# Patient Record
Sex: Female | Born: 1960
Health system: Southern US, Community
[De-identification: ages and names within clinical notes are randomized; demographics above are authoritative.]

## PROBLEM LIST (undated history)

## (undated) DIAGNOSIS — I4892 Unspecified atrial flutter: Secondary | ICD-10-CM

## (undated) DIAGNOSIS — G43909 Migraine, unspecified, not intractable, without status migrainosus: Secondary | ICD-10-CM

## (undated) DIAGNOSIS — G4733 Obstructive sleep apnea (adult) (pediatric): Secondary | ICD-10-CM

## (undated) HISTORY — DX: Obstructive sleep apnea (adult) (pediatric): G47.33

## (undated) HISTORY — DX: Unspecified atrial flutter: I48.92

## (undated) HISTORY — DX: Morbid (severe) obesity due to excess calories: E66.01

## (undated) HISTORY — PX: TONSILLECTOMY: SUR1361

---

## 1997-12-10 ENCOUNTER — Other Ambulatory Visit: Admission: RE | Admit: 1997-12-10 | Discharge: 1997-12-10 | Payer: Self-pay | Admitting: Obstetrics and Gynecology

## 1998-06-10 ENCOUNTER — Inpatient Hospital Stay (HOSPITAL_COMMUNITY): Admission: AD | Admit: 1998-06-10 | Discharge: 1998-06-14 | Payer: Self-pay | Admitting: Obstetrics and Gynecology

## 1998-08-01 ENCOUNTER — Other Ambulatory Visit: Admission: RE | Admit: 1998-08-01 | Discharge: 1998-08-01 | Payer: Self-pay | Admitting: Obstetrics and Gynecology

## 2000-11-19 ENCOUNTER — Encounter: Payer: Self-pay | Admitting: Emergency Medicine

## 2000-11-19 ENCOUNTER — Emergency Department (HOSPITAL_COMMUNITY): Admission: EM | Admit: 2000-11-19 | Discharge: 2000-11-19 | Payer: Self-pay | Admitting: Emergency Medicine

## 2001-03-19 ENCOUNTER — Other Ambulatory Visit: Admission: RE | Admit: 2001-03-19 | Discharge: 2001-03-19 | Payer: Self-pay | Admitting: Obstetrics and Gynecology

## 2014-04-11 DIAGNOSIS — R0789 Other chest pain: Secondary | ICD-10-CM | POA: Diagnosis not present

## 2014-04-11 DIAGNOSIS — R079 Chest pain, unspecified: Secondary | ICD-10-CM | POA: Diagnosis present

## 2014-04-11 DIAGNOSIS — Z8679 Personal history of other diseases of the circulatory system: Secondary | ICD-10-CM | POA: Diagnosis not present

## 2014-04-12 ENCOUNTER — Emergency Department (HOSPITAL_BASED_OUTPATIENT_CLINIC_OR_DEPARTMENT_OTHER): Payer: 59

## 2014-04-12 ENCOUNTER — Emergency Department (HOSPITAL_BASED_OUTPATIENT_CLINIC_OR_DEPARTMENT_OTHER)
Admission: EM | Admit: 2014-04-12 | Discharge: 2014-04-12 | Disposition: A | Discharge status: Home / Self Care | Payer: 59 | Attending: Emergency Medicine | Admitting: Emergency Medicine

## 2014-04-12 ENCOUNTER — Encounter (HOSPITAL_BASED_OUTPATIENT_CLINIC_OR_DEPARTMENT_OTHER): Payer: Self-pay | Admitting: *Deleted

## 2014-04-12 DIAGNOSIS — R079 Chest pain, unspecified: Secondary | ICD-10-CM

## 2014-04-12 HISTORY — DX: Migraine, unspecified, not intractable, without status migrainosus: G43.909

## 2014-04-12 LAB — CBC WITH DIFFERENTIAL/PLATELET
Basophils Absolute: 0.1 10*3/uL (ref 0.0–0.1)
Basophils Relative: 1 % (ref 0–1)
Eosinophils Absolute: 0.2 10*3/uL (ref 0.0–0.7)
Eosinophils Relative: 2 % (ref 0–5)
HCT: 45.8 % (ref 36.0–46.0)
Hemoglobin: 15.1 g/dL — ABNORMAL HIGH (ref 12.0–15.0)
LYMPHS ABS: 2.6 10*3/uL (ref 0.7–4.0)
Lymphocytes Relative: 27 % (ref 12–46)
MCH: 30.1 pg (ref 26.0–34.0)
MCHC: 33 g/dL (ref 30.0–36.0)
MCV: 91.2 fL (ref 78.0–100.0)
Monocytes Absolute: 0.9 10*3/uL (ref 0.1–1.0)
Monocytes Relative: 9 % (ref 3–12)
NEUTROS PCT: 61 % (ref 43–77)
Neutro Abs: 6 10*3/uL (ref 1.7–7.7)
PLATELETS: 352 10*3/uL (ref 150–400)
RBC: 5.02 MIL/uL (ref 3.87–5.11)
RDW: 13.6 % (ref 11.5–15.5)
WBC: 9.7 10*3/uL (ref 4.0–10.5)

## 2014-04-12 LAB — COMPREHENSIVE METABOLIC PANEL
ALBUMIN: 4.1 g/dL (ref 3.5–5.2)
ALK PHOS: 67 U/L (ref 39–117)
ALT: 18 U/L (ref 0–35)
ANION GAP: 10 (ref 5–15)
AST: 21 U/L (ref 0–37)
BILIRUBIN TOTAL: 0.4 mg/dL (ref 0.3–1.2)
BUN: 19 mg/dL (ref 6–23)
CHLORIDE: 105 mmol/L (ref 96–112)
CO2: 25 mmol/L (ref 19–32)
Calcium: 8.9 mg/dL (ref 8.4–10.5)
Creatinine, Ser: 0.68 mg/dL (ref 0.50–1.10)
GFR calc Af Amer: >90 mL/min (ref >90)
GFR calc non Af Amer: >90 mL/min (ref >90)
Glucose, Bld: 120 mg/dL — ABNORMAL HIGH (ref 70–99)
POTASSIUM: 3.7 mmol/L (ref 3.5–5.1)
Sodium: 140 mmol/L (ref 135–145)
TOTAL PROTEIN: 7.3 g/dL (ref 6.0–8.3)

## 2014-04-12 LAB — TROPONIN I: Troponin I: <0.03 ng/mL (ref <0.031)

## 2014-04-12 LAB — LIPASE, BLOOD: Lipase: 33 U/L (ref 11–59)

## 2014-04-12 NOTE — Discharge Instructions (Signed)
Ibuprofen 600 mg every 6 hours as needed for pain.  Call the cardiology clinic to arrange a follow-up appointment in the next 2-3 days. The contact information has been provided in this discharge summary.  Return to the ER if your symptoms significantly worsen or change.   Chest Pain (Nonspecific) It is often hard to give a specific diagnosis for the cause of chest pain. There is always a chance that your pain could be related to something serious, such as a heart attack or a blood clot in the lungs. You need to follow up with your health care provider for further evaluation. CAUSES   Heartburn.  Pneumonia or bronchitis.  Anxiety or stress.  Inflammation around your heart (pericarditis) or lung (pleuritis or pleurisy).  A blood clot in the lung.  A collapsed lung (pneumothorax). It can develop suddenly on its own (spontaneous pneumothorax) or from trauma to the chest.  Shingles infection (herpes zoster virus). The chest wall is composed of bones, muscles, and cartilage. Any of these can be the source of the pain.  The bones can be bruised by injury.  The muscles or cartilage can be strained by coughing or overwork.  The cartilage can be affected by inflammation and become sore (costochondritis). DIAGNOSIS  Lab tests or other studies may be needed to find the cause of your pain. Your health care provider may have you take a test called an ambulatory electrocardiogram (ECG). An ECG records your heartbeat patterns over a 24-hour period. You may also have other tests, such as:  Transthoracic echocardiogram (TTE). During echocardiography, sound waves are used to evaluate how blood flows through your heart.  Transesophageal echocardiogram (TEE).  Cardiac monitoring. This allows your health care provider to monitor your heart rate and rhythm in real time.  Holter monitor. This is a portable device that records your heartbeat and can help diagnose heart arrhythmias. It allows your  health care provider to track your heart activity for several days, if needed.  Stress tests by exercise or by giving medicine that makes the heart beat faster. TREATMENT   Treatment depends on what may be causing your chest pain. Treatment may include:  Acid blockers for heartburn.  Anti-inflammatory medicine.  Pain medicine for inflammatory conditions.  Antibiotics if an infection is present.  You may be advised to change lifestyle habits. This includes stopping smoking and avoiding alcohol, caffeine, and chocolate.  You may be advised to keep your head raised (elevated) when sleeping. This reduces the chance of acid going backward from your stomach into your esophagus. Most of the time, nonspecific chest pain will improve within 2-3 days with rest and mild pain medicine.  HOME CARE INSTRUCTIONS   If antibiotics were prescribed, take them as directed. Finish them even if you start to feel better.  For the next few days, avoid physical activities that bring on chest pain. Continue physical activities as directed.  Do not use any tobacco products, including cigarettes, chewing tobacco, or electronic cigarettes.  Avoid drinking alcohol.  Only take medicine as directed by your health care provider.  Follow your health care provider's suggestions for further testing if your chest pain does not go away.  Keep any follow-up appointments you made. If you do not go to an appointment, you could develop lasting (chronic) problems with pain. If there is any problem keeping an appointment, call to reschedule. SEEK MEDICAL CARE IF:   Your chest pain does not go away, even after treatment.  You have a rash  with blisters on your chest.  You have a fever. SEEK IMMEDIATE MEDICAL CARE IF:   You have increased chest pain or pain that spreads to your arm, neck, jaw, back, or abdomen.  You have shortness of breath.  You have an increasing cough, or you cough up blood.  You have severe  back or abdominal pain.  You feel nauseous or vomit.  You have severe weakness.  You faint.  You have chills. This is an emergency. Do not wait to see if the pain will go away. Get medical help at once. Call your local emergency services (911 in [place] Do not drive yourself to the hospital. MAKE SURE YOU:   Understand these instructions.  Will watch your condition.  Will get help right away if you are not doing well or get worse. Document Released: 10/18/2004 Document Revised: 01/13/2013 Document Reviewed: 08/14/2007 [facility] Patient Information 2015 [place], [place]. This information is not intended to replace advice given to you by your health care provider. Make sure you discuss any questions you have with your health care provider.

## 2014-04-12 NOTE — ED Provider Notes (Signed)
CSN: [id number]     Arrival date & time 04/11/14  2349 History   This chart was scribed for Geoffery Lyonsouglas Meagon Duskin, MD by Abel PrestoKara Demonbreun, ED Scribe. This patient was seen in room MH11/MH11 and the patient's care was started at 12:54 AM.    Chief Complaint  Patient presents with  . Chest Pain     Patient is a 54 y.o. female presenting with chest pain. The history is provided by the patient. No language interpreter was used.  Chest Pain  HPI Comments: Beth Bowman is a 54 y.o. female who presents to the Emergency Department complaining of intermittent chest pain with onset a week ago which has partially resolved. Pt with h/o of chest pain with onset in fall 2015 and was suppose to have stress test done, but decided not to, due to symptoms occuring less often and insurance problems. Pt states pain location and quality varies with pain occasionally radiating to jaw and left arm. Pt denies knowing any aggravating factors. Pt notes associated SOB and palpitations.  Pt states she had begun working out before first onset last fall, but had since ceased exercising until 2 weeks ago.  Pt denies h/o DM, HTN, and HLD. Pt is not a smoker. Pt denies diaphoresis.   Past Medical History  Diagnosis Date  . Migraines    Past Surgical History  Procedure Laterality Date  . Cesarean section      x4  . Tonsillectomy     History reviewed. No pertinent family history. History  Substance Use Topics  . Smoking status: Never Smoker   . Smokeless tobacco: Not on file  . Alcohol Use: No   OB History    No data available     Review of Systems  Cardiovascular: Positive for chest pain.  A complete 10 system review of systems was obtained and all systems are negative except as noted in the HPI and PMH.      Allergies  Review of patient's allergies indicates no known allergies.  Home Medications   Prior to Admission medications   Medication Sig Start Date End Date Taking? Authorizing Provider  Esomeprazole  Magnesium (NEXIUM PO) Take by mouth.   Yes Historical Provider, MD   BP 130/71 mmHg  Pulse 78  Temp(Src) 98.5 F (36.9 C) (Oral)  Resp 18  Ht 5\' 3"  (1.6 m)  Wt 220 lb (99.791 kg)  BMI 38.98 kg/m2  SpO2 100% Physical Exam  Constitutional: She is oriented to person, place, and time. She appears well-developed and well-nourished. No distress.  HENT:  Head: Normocephalic and atraumatic.  Eyes: Conjunctivae and EOM are normal.  Neck: Normal range of motion. Neck supple.  Cardiovascular: Normal rate, regular rhythm and normal heart sounds.   Pulmonary/Chest: Effort normal and breath sounds normal.  Abdominal: Soft. She exhibits no distension. There is no tenderness.  Musculoskeletal: Normal range of motion.  Neurological: She is alert and oriented to person, place, and time.  Skin: Skin is warm and dry.  Psychiatric: She has a normal mood and affect. Her behavior is normal. Judgment normal.  Nursing note and vitals reviewed.   ED Course  Procedures (including critical care time) DIAGNOSTIC STUDIES: Oxygen Saturation is 100% on room air, normal by my interpretation.    COORDINATION OF CARE: 1:02 AM Discussed treatment plan with patient at beside, the patient agrees with the plan and has no further questions at this time.   Labs Review Labs Reviewed  CBC WITH DIFFERENTIAL/PLATELET - Abnormal; Notable for  the following:    Hemoglobin 15.1 (*)    All other components within normal limits  TROPONIN I  LIPASE, BLOOD  COMPREHENSIVE METABOLIC PANEL    Imaging Review No results found.  ED ECG REPORT   Date: 04/12/2014  Rate: 75  Rhythm: normal sinus rhythm  QRS Axis: normal  Intervals: normal  ST/T Wave abnormalities: normal  Conduction Disutrbances:none  Narrative Interpretation:   Old EKG Reviewed: none available  I have personally reviewed the EKG tracing and agree with the computerized printout as noted.   MDM   Final diagnoses:  None    Patient presents with  a several month history of intermittent chest discomfort. It became worse over the past 2 days. She was to have a stress test several months ago, however this did not materialize due to insurance reasons. She began experiencing this discomfort again recently and presents for evaluation of this. Her EKG reveals no acute changes and troponin is negative. She has called a local cardiologist to make an appointment, however has not heard back. If she is unable to make an appointment with the [facility] cardiologist, she was given the follow-up information for the cardiology clinic [facility] with whom she can follow-up. She understands to return if her symptoms worsen or change.   I personally performed the services described in this documentation, which was scribed in my presence. The recorded information has been reviewed and is accurate.      Geoffery Lyons, MD 04/12/14 [phone]

## 2014-04-12 NOTE — ED Notes (Signed)
C/o CP. Pt goes back and forth will old and new information. H/o CP with work up in the fall. Was suppose to have a stress test in the fall but did not. Pt of [facility] Cephus [facility]guilar [facility] referred to [facility] (in [place]). Has been having intermittant and fluctuating CP for ~ 1 week. Took ASA 8-81mg  at 2215. Last nexium taken at 2100. On arrival pain was a 5/10, denies any pain at this time. Last ate 2000.

## 2014-04-14 ENCOUNTER — Encounter: Payer: Self-pay | Admitting: Cardiovascular Disease

## 2014-04-14 ENCOUNTER — Ambulatory Visit (INDEPENDENT_AMBULATORY_CARE_PROVIDER_SITE_OTHER): Payer: 59 | Admitting: Cardiovascular Disease

## 2014-04-14 VITALS — BP 102/80 | HR 89 | Ht 63.0 in | Wt 230.4 lb

## 2014-04-14 DIAGNOSIS — R0789 Other chest pain: Secondary | ICD-10-CM | POA: Diagnosis not present

## 2014-04-14 DIAGNOSIS — R5383 Other fatigue: Secondary | ICD-10-CM | POA: Diagnosis not present

## 2014-04-14 DIAGNOSIS — R5382 Chronic fatigue, unspecified: Secondary | ICD-10-CM

## 2014-04-14 DIAGNOSIS — R002 Palpitations: Secondary | ICD-10-CM | POA: Insufficient documentation

## 2014-04-14 NOTE — Progress Notes (Signed)
Cardiology Office Note   Date:  04/14/2014   ID:  Beth Bowman, DOB [birth year], MRN [id number]  PCP:  No primary care provider on file.  Cardiologist:   Xavior Niazi, Deloris PingPhilip J, MD   Chief Complaint  Patient presents with  . Chest Pain      History of Present Illness: Beth Bowman is a 54 y.o. female who presents for chest pain .   Last fall, she started having some vague upper left sided chest pain.  occasionall she would have upper back pain. Also has left upper arm pain and tingling. She went to see [name] Francene Boyers( Darrell  Kalihl, MD ) , he had initially recommended an echo and then eventually recommended a GXT   She did not have insurance so she did not have the tests.    She also has palpitations for the past year She knows that she does not sleep well.  Wakes up in the early AM - has trouble getting back to sleep. Some early morning headaches. Sleepy during the day.   This past Sunday night, she had some palpitations / heart pounding after taking a nap and went to the ER.   She admits that she has been anxious about all of these symptoms   She joined the gym in the fall.  Did not have any CP or shortness of breath with exercise.  When these symptoms started up several months ago, she stopped going to the gym.   She works for [employer].   Past Medical History  Diagnosis Date  . Migraines     Past Surgical History  Procedure Laterality Date  . Cesarean section      x4  . Tonsillectomy       Current Outpatient Prescriptions  Medication Sig Dispense Refill  . Co-Enzyme Q-10 30 MG CAPS Take 30 mg by mouth daily.    . norethindrone (MICRONOR,CAMILA,ERRIN) 0.35 MG tablet Take by mouth daily.     No current facility-administered medications for this visit.    Allergies:   Review of patient's allergies indicates no known allergies.    Social History:  The patient  reports that she has never smoked. She does not have any smokeless tobacco history on  file. She reports that she does not drink alcohol or use illicit drugs.   Family History:  The patient's family history is not on file.    ROS:  Please see the history of present illness.    Review of Systems: Constitutional:  denies fever, chills, diaphoresis, appetite change and fatigue.  HEENT: denies photophobia, eye pain, redness, hearing loss, ear pain, congestion, sore throat, rhinorrhea, sneezing, neck pain, neck stiffness and tinnitus.  Respiratory: admits to  DOE,   Cardiovascular: admits to chest pain, palpitations and.  Denies any  leg swelling.  Gastrointestinal: denies nausea, vomiting, abdominal pain, diarrhea, constipation, blood in stool.  Genitourinary: denies dysuria, urgency, frequency, hematuria, flank pain and difficulty urinating.  Musculoskeletal: denies  myalgias, back pain, joint swelling, arthralgias and gait problem.   Skin: denies pallor, rash and wound.  Neurological: denies dizziness, seizures, syncope, weakness, light-headedness, numbness and headaches.   Hematological: denies adenopathy, easy bruising, personal or family bleeding history.  Psychiatric/ Behavioral: denies suicidal ideation, mood changes, confusion, nervousness, sleep disturbance and agitation.       All other systems are reviewed and negative.    PHYSICAL EXAM: VS:  BP 102/80 mmHg  Pulse 89  Ht 5\' 3"  (1.6 m)  Wt 230 lb  6.4 oz (104.509 kg)  BMI 40.82 kg/m2 , BMI Body mass index is 40.82 kg/(m^2). GEN: Well nourished, well developed, in no acute distress HEENT: normal Neck: no JVD, carotid bruits, or masses Cardiac: RRR; no murmurs, rubs, or gallops,no edema  Respiratory:  clear to auscultation bilaterally, normal work of breathing GI: soft, nontender, nondistended, + BS MS: no deformity or atrophy Skin: warm and dry, no rash Neuro:  Strength and sensation are intact Psych: normal   EKG:  EKG is ordered today. The ekg ordered today demonstrates NSR at 89 RAD, NS ST changes,  no changes from previous ECG    Recent Labs: 04/12/2014: ALT 18; BUN 19; Creatinine 0.68; Hemoglobin 15.1*; Platelets 352; Potassium 3.7; Sodium 140    Lipid Panel No results found for: CHOL, TRIG, HDL, CHOLHDL, VLDL, LDLCALC, LDLDIRECT    Wt Readings from Last 3 Encounters:  04/14/14 230 lb 6.4 oz (104.509 kg)  04/12/14 220 lb (99.791 kg)      Other studies Reviewed: Additional studies/ records that were reviewed today include: . Review of the above records demonstrates:    ASSESSMENT AND PLAN:  1.  Palpitations/fatigue: The patient present with multiple palpitations, atypical chest pains, generalized fatigue. She seen a cardiologist last year but did not return. Her symptoms do not sound specifically cardiac to me. I suspect that she may have sleep apnea. She admits that she has trouble sleeping and wakes up very tired. In fact one of her episodes of racing heart occurred after she had taken a nap.  He knows that she snores very loudly but her husband has never told her that she has apneic episodes at night. This may be because he also snores loudly.  Because of her nonspecific EKG changes and her palpitations we will get an echocardiogram. It will be helpful to know that she has normal cardiac function and normal valvular function. We will also schedule her for a split-night sleep study. I'll see her back in the office in 2-3 months for follow-up visit.  I strongly advised her to work on a good diet, exercise, and weight loss program. We'll check fasting lipids today as well as a TSH.  Current medicines are reviewed at length with the patient today.  The patient does not have concerns regarding medicines.  The following changes have been made:  no change  Labs/ tests ordered today include:  No orders of the defined types were placed in this encounter.     Disposition:   FU with me in 2-3 months     Signed, Cortez Flippen, Deloris Ping, MD  04/14/2014 3:32 PM    [facility] [address] [place], [place], [place]  [address] Phone: [phone]; Fax: [phone]

## 2014-04-14 NOTE — Patient Instructions (Addendum)
Your physician recommends that you return for lab work: TODAY (TSH/Lipid)  Your physician has recommended that you have a sleep study. This test records several body functions during sleep, including: brain activity, eye movement, oxygen and carbon dioxide blood levels, heart rate and rhythm, breathing rate and rhythm, the flow of air through your mouth and nose, snoring, body muscle movements, and chest and belly movement.  Your physician has requested that you have an echocardiogram. Echocardiography is a painless test that uses sound waves to create images of your heart. It provides your doctor with information about the size and shape of your heart and how well your heart's chambers and valves are working. This procedure takes approximately one hour. There are no restrictions for this procedure.  Your physician recommends that you schedule a follow-up appointment in: 2-3 months with Dr. Elease HashimotoNahser  Your physician recommends that you continue on your current medications as directed. Please refer to the Current Medication list given to you today.

## 2014-04-15 ENCOUNTER — Ambulatory Visit (HOSPITAL_COMMUNITY): Payer: 59 | Attending: Cardiovascular Disease | Admitting: Radiology

## 2014-04-15 DIAGNOSIS — R5382 Chronic fatigue, unspecified: Secondary | ICD-10-CM | POA: Insufficient documentation

## 2014-04-15 DIAGNOSIS — R0789 Other chest pain: Secondary | ICD-10-CM | POA: Diagnosis not present

## 2014-04-15 DIAGNOSIS — R002 Palpitations: Secondary | ICD-10-CM | POA: Diagnosis not present

## 2014-04-15 LAB — LIPID PANEL
CHOLESTEROL: 167 mg/dL (ref 0–200)
HDL: 49.3 mg/dL (ref >39.00)
LDL Cholesterol: 101 mg/dL — ABNORMAL HIGH (ref 0–99)
NonHDL: 117.7
Total CHOL/HDL Ratio: 3
Triglycerides: 86 mg/dL (ref 0.0–149.0)
VLDL: 17.2 mg/dL (ref 0.0–40.0)

## 2014-04-15 LAB — TSH: TSH: 1.56 u[IU]/mL (ref 0.35–4.50)

## 2014-04-15 NOTE — Progress Notes (Signed)
Echocardiogram performed.  

## 2014-04-30 ENCOUNTER — Telehealth: Payer: Self-pay | Admitting: Cardiovascular Disease

## 2014-04-30 MED ORDER — METOPROLOL SUCCINATE ER 25 MG PO TB24: 25.0000 mg | ORAL_TABLET | Freq: Every day | ORAL | Status: DC

## 2014-04-30 NOTE — Telephone Encounter (Signed)
Left message for patient to call office and ask for [place]le, [place]

## 2014-04-30 NOTE — Telephone Encounter (Signed)
Spoke with patient who states she is experiencing fast heart rate, high blood pressure and palpitations onset 11 PM yesterday; felt light headed, palpitations after eating a bowl of cereal - 118/98, HR 126  Vitals now BP:  130/107, HR 137 possibly irregular; feels weak, jittery currently Denies chest pain/pressure; mild SOB; patient is able to speak in full complete sentences without difficulty while on phone; denies n/v; denies light-headedness

## 2014-04-30 NOTE — Telephone Encounter (Signed)
Left message for patient to call me back. 

## 2014-04-30 NOTE — Telephone Encounter (Signed)
Pt calling c/o tachycardia-rapid heartbeats-elevated BP- wants to know what she should do--pls advise [phone]

## 2014-04-30 NOTE — Telephone Encounter (Signed)
I reviewed patient's complaint with Dr. Elease HashimotoNahser who advised patient start Toprol XL 25 mg once daily.  I will offer patient a nurse visit next week to check HR and BP.

## 2014-04-30 NOTE — Telephone Encounter (Signed)
Follow Up  Pt returning Michelle's phone call. Please call back and discuss.  

## 2014-04-30 NOTE — Telephone Encounter (Signed)
Spoke with patient and reviewed Dr. Harvie BridgeNahser's advice with her.  Patient verbalized understanding and agreement to start Toprol XL 25 mg once daily.  I advised patient that we would like to set up a nurse visit for later in the week for ekg/BP to assess how patient is responding to the medication.  Patient scheduled for nurse visit Thursday 4/14.  Patient is concerned as to the cause of the fast HR and elevated BP.  I advised patient that Dr. Elease HashimotoNahser believes symptoms are r/t sleep apnea and that heart rate is sinus tachycardia.  I advised patient that if HR does not decrease with Toprol or if she becomes light-headed, dizzy, or develops h/a or other concerns to call the oncall provider over the weekend or go to the ED.  I advised patient to use relaxation methods, to decrease intake of caffeine, and to continue to work on healthy low-fat, low-sodium diet and light exercise.  Patient verbalized understanding and agreement with plan.

## 2014-04-30 NOTE — Telephone Encounter (Signed)
Agree with not from Eligha BridegroomMichelle Swinyer, RN. Will start toprol xl 25 a day. Will work her in to see my Monday if she is able

## 2014-05-06 ENCOUNTER — Encounter: Payer: Self-pay | Admitting: Cardiovascular Disease

## 2014-05-06 ENCOUNTER — Ambulatory Visit (INDEPENDENT_AMBULATORY_CARE_PROVIDER_SITE_OTHER): Payer: 59 | Admitting: Nurse Practitioner

## 2014-05-06 VITALS — BP 110/70 | HR 73 | Resp 16 | Wt 230.5 lb

## 2014-05-06 DIAGNOSIS — R002 Palpitations: Secondary | ICD-10-CM | POA: Diagnosis not present

## 2014-05-06 NOTE — Patient Instructions (Signed)
Your physician recommends that you keep your follow-up appointment on June 8 at 4:30 with Dr. Elease HashimotoNahser  Your physician recommends that you continue on your current medications as directed. Please refer to the Current Medication list given to you today.

## 2014-05-06 NOTE — Progress Notes (Signed)
1.) Reason for visit: EKG and BP check for recent addition of Toprol XL 25 mg once daily for c/o palpitations, feeling like heart racing and difficulty sleeping  2.) Name of MD requesting visit: Dr. Elease HashimotoNahser  3.) H&P: Recent hx of heart palpitations; started on Toprol XL on Friday April 8, here for ekg and bp check to evaluate efficacy  4.) ROS related to problem: Patient states she is feeling well; states notices slight fatigue since starting Toprol and understands that this should improve over the next 1-2 weeks; patient is aware to call back with worsening symptoms or concerns  5.) Assessment and plan per MD: follow up with Dr. Elease HashimotoNahser as previously scheduled on June 8; pt has sleep study ordered for late May

## 2014-06-03 ENCOUNTER — Ambulatory Visit: Payer: 59 | Admitting: Cardiovascular Disease

## 2014-06-18 ENCOUNTER — Ambulatory Visit (HOSPITAL_BASED_OUTPATIENT_CLINIC_OR_DEPARTMENT_OTHER): Payer: 59 | Attending: Cardiovascular Disease | Admitting: *Deleted

## 2014-06-18 VITALS — Ht 63.0 in | Wt 218.0 lb

## 2014-06-18 DIAGNOSIS — I491 Atrial premature depolarization: Secondary | ICD-10-CM | POA: Diagnosis not present

## 2014-06-18 DIAGNOSIS — R5382 Chronic fatigue, unspecified: Secondary | ICD-10-CM

## 2014-06-18 DIAGNOSIS — R002 Palpitations: Secondary | ICD-10-CM

## 2014-06-18 DIAGNOSIS — R0683 Snoring: Secondary | ICD-10-CM | POA: Insufficient documentation

## 2014-06-18 DIAGNOSIS — G4733 Obstructive sleep apnea (adult) (pediatric): Secondary | ICD-10-CM | POA: Insufficient documentation

## 2014-06-18 DIAGNOSIS — G4710 Hypersomnia, unspecified: Secondary | ICD-10-CM | POA: Diagnosis present

## 2014-06-22 ENCOUNTER — Encounter (HOSPITAL_BASED_OUTPATIENT_CLINIC_OR_DEPARTMENT_OTHER): Payer: Self-pay | Admitting: *Deleted

## 2014-06-22 ENCOUNTER — Telehealth: Payer: Self-pay | Admitting: Cardiology

## 2014-06-22 DIAGNOSIS — G4733 Obstructive sleep apnea (adult) (pediatric): Secondary | ICD-10-CM

## 2014-06-22 HISTORY — DX: Obstructive sleep apnea (adult) (pediatric): G47.33

## 2014-06-22 NOTE — Telephone Encounter (Signed)
Left message to call back  

## 2014-06-22 NOTE — Sleep Study (Signed)
   NAME: Beth StakesJill L Merrow DATE OF BIRTH:  [birth year] MEDICAL RECORD NUMBER [id number]  LOCATION: [facility]  PHYSICIAN: Makyna Niehoff R  DATE OF STUDY: 06/18/2014  SLEEP STUDY TYPE: Nocturnal Polysomnogram               REFERRING PHYSICIAN: Nahser, Deloris PingPhilip J, MD  INDICATION FOR STUDY: excessive daytime sleepiness, nonrestorative sleep  EPWORTH SLEEPINESS SCORE: 12 HEIGHT: 5\' 3"  (160 cm)  WEIGHT: 218 lb (98.884 kg)    Body mass index is 38.63 kg/(m^2).  NECK SIZE: 15 in.  MEDICATIONS: Reviewed in the chart  SLEEP ARCHITECTURE: The patient slept for a total of 270 minutes out of a total sleep period time of 340 minutes.  There was 6 minutes of slow wave sleep and 41 minutes of REM sleep.  The onset to sleep latency was 25 minutes and the onset to REM sleep latency was prolonged at 233 minutes.  The sleep efficiency was reduced at 74%.  RESPIRATORY DATA: There were 7 obstructive and 6 central apneas.  There were 17 hypopneas.  The total AHI was 6.7 events per hour.  All events occurred in NREM sleep and most occurred in the non supine position.  There was mild snoring.    OXYGEN DATA: The average oxygen saturation was 94%.  The lowest oxygen saturation was 87%.    CARDIAC DATA: The patient maintained NSR with PAC's during the study.  The average heart rate was 64 bpm.  The slowest heart rate was 37 bpm.  MOVEMENT/PARASOMNIA: There were no limb movements or REM sleep behavior disorders noted during sleep.  IMPRESSION/ RECOMMENDATION:   1.  Very mild obstructive sleep apnea/hypopnea syndrome with an overall AHI of 6.7 events per hour.  Most events occurred in the non supine position and all events occurred during NREM sleep. 2.  Mild snoring was noted. 3.  Abnormal sleep architecture with prolonged latency to REM sleep. 4.  Reduced sleep efficiency with increased frequency of arousals due to mainly spontaneous events. 5.  Minimal oxygen desaturations to 87% with  respiratory events. 6.  The patient was noted to have PAC's during the study. 7.  Given the mild degree of sleep disordered breathing noted, treatment options include a trial of weigh loss, referral to ENT to evaluate for surgical causes of mild OSA/snoring, referral to dentistry for oral device or a trial of CPAP therapy.   8.  The patient should be counseled in good sleep hygiene.   Signed: Quintella ReichertURNER,Teresia Myint R Diplomate, American Board of Sleep Medicine  ELECTRONICALLY SIGNED ON:  06/22/2014, 2:37 PM [facility] PH: [phone]   FX: [phone] ACCREDITED BY THE AMERICAN ACADEMY OF SLEEP MEDICINE

## 2014-06-22 NOTE — Telephone Encounter (Signed)
Please let patient know that she has very mild OSA.  I would like her set up for an OV to discuss treatment options.

## 2014-06-24 NOTE — Telephone Encounter (Signed)
Patient is aware of information.   Appt. To discuss treatment has been made for 09/02/14 @8 :30

## 2014-06-30 ENCOUNTER — Ambulatory Visit: Payer: 59 | Admitting: Cardiovascular Disease

## 2014-07-08 ENCOUNTER — Emergency Department (HOSPITAL_COMMUNITY): Payer: 59

## 2014-07-08 ENCOUNTER — Telehealth: Payer: Self-pay | Admitting: *Deleted

## 2014-07-08 ENCOUNTER — Telehealth: Payer: Self-pay | Admitting: Cardiovascular Disease

## 2014-07-08 ENCOUNTER — Encounter (HOSPITAL_COMMUNITY): Payer: Self-pay | Admitting: Emergency Medicine

## 2014-07-08 ENCOUNTER — Inpatient Hospital Stay (HOSPITAL_COMMUNITY)
Admission: EM | Admit: 2014-07-08 | Discharge: 2014-07-09 | DRG: 309 | Disposition: A | Discharge status: Home / Self Care | Payer: 59 | Attending: Cardiovascular Disease | Admitting: Cardiovascular Disease

## 2014-07-08 DIAGNOSIS — Z6841 Body Mass Index (BMI) 40.0 and over, adult: Secondary | ICD-10-CM | POA: Diagnosis not present

## 2014-07-08 DIAGNOSIS — G4733 Obstructive sleep apnea (adult) (pediatric): Secondary | ICD-10-CM | POA: Diagnosis present

## 2014-07-08 DIAGNOSIS — R002 Palpitations: Secondary | ICD-10-CM | POA: Diagnosis not present

## 2014-07-08 DIAGNOSIS — I4892 Unspecified atrial flutter: Secondary | ICD-10-CM | POA: Diagnosis present

## 2014-07-08 DIAGNOSIS — Z7982 Long term (current) use of aspirin: Secondary | ICD-10-CM

## 2014-07-08 DIAGNOSIS — E6609 Other obesity due to excess calories: Secondary | ICD-10-CM | POA: Diagnosis present

## 2014-07-08 DIAGNOSIS — Z79899 Other long term (current) drug therapy: Secondary | ICD-10-CM | POA: Diagnosis not present

## 2014-07-08 DIAGNOSIS — E669 Obesity, unspecified: Secondary | ICD-10-CM | POA: Diagnosis present

## 2014-07-08 LAB — BASIC METABOLIC PANEL
Anion gap: 14 (ref 5–15)
BUN: 13 mg/dL (ref 6–20)
CALCIUM: 9.7 mg/dL (ref 8.9–10.3)
CO2: 18 mmol/L — ABNORMAL LOW (ref 22–32)
CREATININE: 0.83 mg/dL (ref 0.44–1.00)
Chloride: 106 mmol/L (ref 101–111)
GFR calc non Af Amer: >60 mL/min (ref >60)
Glucose, Bld: 143 mg/dL — ABNORMAL HIGH (ref 65–99)
Potassium: 4.2 mmol/L (ref 3.5–5.1)
Sodium: 138 mmol/L (ref 135–145)

## 2014-07-08 LAB — CBC WITH DIFFERENTIAL/PLATELET
BASOS ABS: 0.1 10*3/uL (ref 0.0–0.1)
Basophils Relative: 1 % (ref 0–1)
EOS ABS: 0.1 10*3/uL (ref 0.0–0.7)
EOS PCT: 1 % (ref 0–5)
HCT: 51.5 % — ABNORMAL HIGH (ref 36.0–46.0)
Hemoglobin: 17.7 g/dL — ABNORMAL HIGH (ref 12.0–15.0)
LYMPHS PCT: 27 % (ref 12–46)
Lymphs Abs: 3.7 10*3/uL (ref 0.7–4.0)
MCH: 31 pg (ref 26.0–34.0)
MCHC: 34.4 g/dL (ref 30.0–36.0)
MCV: 90.2 fL (ref 78.0–100.0)
MONO ABS: 1.1 10*3/uL — AB (ref 0.1–1.0)
Monocytes Relative: 8 % (ref 3–12)
Neutro Abs: 8.8 10*3/uL — ABNORMAL HIGH (ref 1.7–7.7)
Neutrophils Relative %: 63 % (ref 43–77)
Platelets: 459 10*3/uL — ABNORMAL HIGH (ref 150–400)
RBC: 5.71 MIL/uL — AB (ref 3.87–5.11)
RDW: 13.4 % (ref 11.5–15.5)
WBC: 13.8 10*3/uL — ABNORMAL HIGH (ref 4.0–10.5)

## 2014-07-08 LAB — BRAIN NATRIURETIC PEPTIDE: B Natriuretic Peptide: 237.5 pg/mL — ABNORMAL HIGH (ref 0.0–100.0)

## 2014-07-08 LAB — TSH: TSH: 2.79 u[IU]/mL (ref 0.350–4.500)

## 2014-07-08 LAB — TROPONIN I

## 2014-07-08 LAB — I-STAT TROPONIN, ED: TROPONIN I, POC: 0 ng/mL (ref 0.00–0.08)

## 2014-07-08 MED ORDER — ACETAMINOPHEN 325 MG PO TABS: 650.0000 mg | ORAL_TABLET | ORAL | Status: DC | PRN

## 2014-07-08 MED ORDER — ZOLPIDEM TARTRATE 5 MG PO TABS: 5.0000 mg | ORAL_TABLET | Freq: Every evening | ORAL | Status: DC | PRN

## 2014-07-08 MED ORDER — ADENOSINE 6 MG/2ML IV SOLN
INTRAVENOUS | Status: AC
  Filled 2014-07-08: qty 2

## 2014-07-08 MED ORDER — ADENOSINE 6 MG/2ML IV SOLN
INTRAVENOUS | Status: DC | PRN
  Administered 2014-07-08: 12 mg via INTRAVENOUS
  Administered 2014-07-08: 6 mg via INTRAVENOUS

## 2014-07-08 MED ORDER — SODIUM CHLORIDE 0.9 % IV SOLN: 250.0000 mL | INTRAVENOUS | Status: DC | PRN

## 2014-07-08 MED ORDER — DILTIAZEM HCL 100 MG IV SOLR
5.0000 mg/h | INTRAVENOUS | Status: DC
  Administered 2014-07-08: 5 mg/h via INTRAVENOUS
  Administered 2014-07-09: 10 mg/h via INTRAVENOUS
  Filled 2014-07-08 (×2): qty 100

## 2014-07-08 MED ORDER — HEPARIN (PORCINE) IN NACL 100-0.45 UNIT/ML-% IJ SOLN
1200.0000 [IU]/h | INTRAMUSCULAR | Status: DC
  Administered 2014-07-08 (×2): 1200 [IU]/h via INTRAVENOUS
  Filled 2014-07-08 (×2): qty 250

## 2014-07-08 MED ORDER — DILTIAZEM LOAD VIA INFUSION
10.0000 mg | Freq: Once | INTRAVENOUS | Status: AC
  Administered 2014-07-08: 10 mg via INTRAVENOUS
  Filled 2014-07-08: qty 10

## 2014-07-08 MED ORDER — ONDANSETRON HCL 4 MG/2ML IJ SOLN: 4.0000 mg | Freq: Four times a day (QID) | INTRAMUSCULAR | Status: DC | PRN

## 2014-07-08 MED ORDER — ADENOSINE 6 MG/2ML IV SOLN: 6.0000 mg | Freq: Once | INTRAVENOUS | Status: AC

## 2014-07-08 MED ORDER — ADENOSINE 6 MG/2ML IV SOLN
INTRAVENOUS | Status: AC
  Filled 2014-07-08: qty 4

## 2014-07-08 MED ORDER — NITROGLYCERIN 0.4 MG SL SUBL: 0.4000 mg | SUBLINGUAL_TABLET | SUBLINGUAL | Status: DC | PRN

## 2014-07-08 MED ORDER — SODIUM CHLORIDE 0.9 % IJ SOLN
3.0000 mL | Freq: Two times a day (BID) | INTRAMUSCULAR | Status: DC
  Administered 2014-07-08: 3 mL via INTRAVENOUS

## 2014-07-08 MED ORDER — HEPARIN BOLUS VIA INFUSION
4000.0000 [IU] | Freq: Once | INTRAVENOUS | Status: AC
  Administered 2014-07-08: 4000 [IU] via INTRAVENOUS
  Filled 2014-07-08: qty 4000

## 2014-07-08 MED ORDER — ALPRAZOLAM 0.25 MG PO TABS: 0.2500 mg | ORAL_TABLET | Freq: Two times a day (BID) | ORAL | Status: DC | PRN

## 2014-07-08 NOTE — H&P (Signed)
History and Physical   Patient ID: Beth Bowman MRN: [id number], DOB/AGE: [birth year] 54 y.o. Date of Encounter: 07/08/2014  Primary Physician: No primary care provider on file. Primary Cardiologist: Dr. Elease Hashimoto  Chief Complaint:  Palpitations, atrial flutter with RVR  HPI: Beth Bowman is a 54 y.o. female with a history of palpitations for which Dr. Elease Hashimoto evaluated her with an echocardiogram, but no diagnosis was obtained. She was started on a low dose of the beta blocker.  She has continued to have occasional palpitations. She will check her blood pressure upon occasion when she is feeling better heart rate is never described as irregular on the machine and her heart rate is never very elevated. At one point, her heart rate was in the 120s-130s, which was just before she was started on a beta blocker. However, the machine did not give her an air message saying that her heart rate was irregular.  She was under emotional stress last p.m. but was able to get some sleep. Today, she got up and went to work as usual. When she got up, she felt palpitations and was aware that her heart rate was elevated. She did not have presyncope or syncope. She did not have chest pain or shortness of breath. The palpitations continued all day. She finally got someone to check her blood pressure and heart rate and it was noted that her heart rate was greater than 170. She was sent to the emergency room.  In the emergency room, her heart rate was as high as 176. She was given adenosine 6 mg and then 12 mg. With a 12 mg dose, her heart rate slowed to the point the flutter waves were visible. She is currently being started on Cardizem.  She has never been diagnosed with atrial flutter and has no relatives with significant arrhythmias. She has no history of thyroid issues, has no recent change in the amount of caffeine that she uses on a daily basis, and has used no over-the-counter cold medications. She  rarely drinks any alcohol and did not have any recently. No drug use.  The Cardizem has been ordered by the ER staff and is not yet been started. Currently, her heart rate is in the 150s and she is aware of her rapid heart rate, but is otherwise asymptomatic.   Past Medical History  Diagnosis Date  . Migraines   . OSA (obstructive sleep apnea) 06/22/2014    Mild with AHI 6.7/hr    Surgical History:  Past Surgical History  Procedure Laterality Date  . Cesarean section      x4  . Tonsillectomy       I have reviewed the patient's current medications. Prior to Admission medications   Medication Sig Start Date End Date Taking? Authorizing Provider  Co-Enzyme Q-10 30 MG CAPS Take 30 mg by mouth daily.    Historical Provider, MD  metoprolol succinate (TOPROL XL) 25 MG 24 hr tablet Take 1 tablet (25 mg total) by mouth daily. 04/30/14   Vesta Mixer, MD  norethindrone (MICRONOR,CAMILA,ERRIN) 0.35 MG tablet Take by mouth daily.    Historical Provider, MD   Scheduled Meds:   Continuous Infusions: . diltiazem (CARDIZEM) infusion 5 mg/hr (07/08/14 1803)   PRN Meds:.adenosine  Allergies: No Known Allergies  History   Social History  . Marital Status: Single    Spouse Name: N/A  . Number of Children: N/A  . Years of Education: N/A   Occupational History  .  [employer]    Social History Main Topics  . Smoking status: Never Smoker   . Smokeless tobacco: Never Used  . Alcohol Use: 0.0 oz/week    0 Standard drinks or equivalent per week     Comment: Rare, no history of abuse  . Drug Use: No  . Sexual Activity: Not on file   Other Topics Concern  . Not on file   Social History Narrative   Lives in [place] with her husband.    Family Status  Relation Status Death Age  . Mother Deceased 82    No premature CAD, no history of arrhythmia  . Father Deceased     No premature CAD, possible arrhythmia but no further details available    Review of Systems:   Full 14-point review of  systems otherwise negative except as noted above.  Physical Exam: Blood pressure 111/73, pulse 155, resp. rate 20, SpO2 98 %. General: Well developed, well nourished,female in no acute distress. Head: Normocephalic, atraumatic, sclera non-icteric, no xanthomas, nares are without discharge. Dentition: Good Neck: No carotid bruits. JVD not elevated. No thyromegally Lungs: Good expansion bilaterally. without wheezes or rhonchi.  Heart: Rapid and IRRegular rate and rhythm with S1 S2.  No S3 or S4.  No murmur, no rubs, or gallops appreciated. Abdomen: Soft, non-tender, non-distended with normoactive bowel sounds. No hepatomegaly. No rebound/guarding. No obvious abdominal masses. Msk:  Strength and tone appear normal for age. No joint deformities or effusions, no spine or costo-vertebral angle tenderness. Extremities: No clubbing or cyanosis. No edema.  Distal pedal pulses are 2+ in 4 extrem Neuro: Alert and oriented X 3. Moves all extremities spontaneously. No focal deficits noted. Psych:  Responds to questions appropriately with a normal affect. Skin: No rashes or lesions noted  Labs:   Lab Results  Component Value Date   WBC 13.8* 07/08/2014   HGB 17.7* 07/08/2014   HCT 51.5* 07/08/2014   MCV 90.2 07/08/2014   PLT 459* 07/08/2014   No results for input(s): INR in the last 72 hours. No results for input(s): NA, K, CL, CO2, BUN, CREATININE, CALCIUM, PROT, BILITOT, ALKPHOS, ALT, AST, GLUCOSE in the last 168 hours.  Invalid input(s): LABALBU No results for input(s): CKTOTAL, CKMB, TROPONINI in the last 72 hours.  Recent Labs  07/08/14 1744  TROPIPOC 0.00   Lab Results  Component Value Date   CHOL 167 04/14/2014   HDL 49.30 04/14/2014   LDLCALC 101* 04/14/2014   TRIG 86.0 04/14/2014   Radiology/Studies: No results found.   Echo: 04/15/2014 Conclusions - Left ventricle: The cavity size was normal. Wall thickness was normal. Systolic function was normal. The estimated  ejection fraction was in the range of 60% to 65%. Wall motion was normal; there were no regional wall motion abnormalities. Features are consistent with a pseudonormal left ventricular filling pattern, with concomitant abnormal relaxation and increased filling pressure (grade 2 diastolic dysfunction). - Left atrium: The atrium was mildly dilated.  ECG: 07/08/2014 Atrial flutter with variable AV block, rate 176  ASSESSMENT AND PLAN:  Principal Problem:   Atrial flutter with rapid ventricular response - Admits to 3 W. with IV Cardizem for rate control - Check TSH, check LFTs - Follow on telemetry - Once heart rate control is improved, decide if rhythm control is indicated - If heart rate is poorly controlled on 06/17, maybe cardioversion - The patient is very clear that she woke up with this this morning and it was not going on yesterday, so no  TEE needed - Start heparin, changed to oral anticoagulation later  Active Problems:   Obesity - Encourage heart healthy diet   Signed, Leanna Battles 07/08/2014 6:12 PM Beeper [phone]    Agree with note by Theodore Demark PA-C  Admitted with SVT shown to be Aflutter with 2:1 after administration of IV adenosine. No other signif medical probs. On low dose BB. Nl 2D in March. Stressful night last night and awoke with tachy. Worked all day and presented to ER.Did not break with IV adenosine but slowed demonstrating classic flutter waves.  No CP/SOB. Exam benign. Labs pending. Will admit, bolus dilt and drip. IV hep. If she doesn't convert overnight will need DCCV tomorrow.    Runell Gess, M.D., FACP, [facility], [facility] [address] [place], [place]  [address]  [phone] 07/08/2014 6:17 PM

## 2014-07-08 NOTE — Telephone Encounter (Signed)
called pt for fm hx & status, unable to reach pt... 

## 2014-07-08 NOTE — ED Notes (Signed)
Cardiology at bedside.

## 2014-07-08 NOTE — Progress Notes (Signed)
ANTICOAGULATION CONSULT NOTE - Initial Consult  Pharmacy Consult for Heparin Indication: aflutter  No Known Allergies  Patient Measurements: Height: 5\' 2"  (157.5 cm) Weight: 235 lb 8 oz (106.822 kg) IBW/kg (Calculated) : 50.1 Heparin Dosing Weight:  76 kg  Vital Signs: Temp: 98.6 F (37 C) (06/16 2100) Temp Source: Oral (06/16 2100) BP: 115/81 mmHg (06/16 2100) Pulse Rate: 102 (06/16 2100)  Labs:  Recent Labs  07/08/14 1738  HGB 17.7*  HCT 51.5*  PLT 459*  CREATININE 0.83    Estimated Creatinine Clearance: 89.1 mL/min (by C-G formula based on Cr of 0.83).   Medical History: Past Medical History  Diagnosis Date  . Migraines   . OSA (obstructive sleep apnea) 06/22/2014    Mild with AHI 6.7/hr    Medications:  Prescriptions prior to admission  Medication Sig Dispense Refill Last Dose  . CALCIUM-VITAMIN D PO Take 1 tablet by mouth every other day.   Past Week at Unknown time  . chlorpheniramine (CHLOR-TRIMETON) 4 MG tablet Take 4 mg by mouth 2 (two) times daily as needed for allergies.   Past Month at Unknown time  . Coenzyme Q10 (COQ10 PO) Take 1 capsule by mouth daily.   07/07/2014 at Unknown time  . diphenhydrAMINE (BENADRYL) 25 MG tablet Take 25 mg by mouth every 6 (six) hours as needed for allergies.   Past Month at Unknown time  . GARLIC PO Take 1 tablet by mouth daily.   07/07/2014 at Unknown time  . MAGNESIUM PO Take 1 tablet by mouth daily.   07/07/2014 at Unknown time  . metoprolol succinate (TOPROL XL) 25 MG 24 hr tablet Take 1 tablet (25 mg total) by mouth daily. 31 tablet 11 07/07/2014 at 2300  . OVER THE COUNTER MEDICATION Take 1 tablet by mouth daily. NATTO KINASE FOR BP   07/08/2014 at Unknown time  . tetrahydrozoline 0.05 % ophthalmic solution Place 1 drop into both eyes as needed (FOR DRY EYES).   07/07/2014 at Unknown time    Assessment: 54 y/o morbidly obese female with PMH of OSA and palpitations presents with c/o high BP (taken at home),  tachycardia, and heart palpitations. Dx: Aflutter with RVR. Placed on IV Cardizem for rate control.   Labs: K+4.2, Scr 0.83, proBNP 237.5. Troponins negative, WBC elevated 13.8, Hgb 17.7, Plts 459.   Goal of Therapy:  Heparin level 0.3-0.7 units/ml Monitor platelets by anticoagulation protocol: Yes   Plan:  Check TSH (3/16 was 1.56 ok) and LFT's (3/16 WNL) Start IV heparin 4000 units Heparin infusion 1200 units/hr Check heparin level and CBC daily.    Ferd Horrigan S. Merilynn Finland, PharmD, BCPS Clinical Staff Pharmacist Pager [phone]  Misty Stanley Stillinger 07/08/2014,9:18 PM

## 2014-07-08 NOTE — ED Notes (Signed)
Pts HR now 99 and sinus rhythm. New EKG printed.

## 2014-07-08 NOTE — Telephone Encounter (Signed)
Follow up     Calling to give [facility] new bp readings Bp reading 4:36pm  149/96  Pulse 89  And at 4:40pm  Bp 152/110 and HR 158

## 2014-07-08 NOTE — Telephone Encounter (Signed)
New Prob  Pt c/o BP issue: STAT if pt c/o blurred vision, one-sided weakness or slurred speech  1. What are your last 5 BP readings? 143/119 146/109 taken 10 minutes ago  2. Are you having any other symptoms (ex. Dizziness, headache, blurred vision, passed out)? No  3. What is your BP issue? Pt states her BP is high and also reports irregular heartbeat

## 2014-07-08 NOTE — ED Notes (Signed)
Pt placed on zoll upon arrival to ED POD A room 1.

## 2014-07-08 NOTE — ED Notes (Signed)
Dr. Rancour at bedside. 

## 2014-07-08 NOTE — ED Provider Notes (Signed)
CSN: [id number]     Arrival date & time 07/08/14  1726 History   First MD Initiated Contact with Patient 07/08/14 1746     Chief Complaint  Patient presents with  . Irregular Heart Beat    The patient said she was at work and was feeling her hear racing.  She denies any other symptoms.       (Consider location/radiation/quality/duration/timing/severity/associated sxs/prior Treatment) HPI Comments: Patient reports palpitations and racing heart since she woke up this morning. The symptoms are coming and going. She denies any chest pain, shortness of breath, nausea or vomiting. She denies similar episodes in the past. She was told she has a history of "tachycardia" and was prescribed Toprol by her cardiologist. No history of atrial fibrillation or SVT. 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 They felt she had SVT and referred her to the ED. She endorses some dizziness and lightheadedness.  The history is provided by the patient. The history is limited by the condition of the patient.    Past Medical History  Diagnosis Date  . Migraines   . OSA (obstructive sleep apnea) 06/22/2014    Mild with AHI 6.7/hr   Past Surgical History  Procedure Laterality Date  . Cesarean section      x4  . Tonsillectomy     History reviewed. No pertinent family history. History  Substance Use Topics  . Smoking status: Never Smoker   . Smokeless tobacco: Never Used  . Alcohol Use: 0.0 oz/week    0 Standard drinks or equivalent per week     Comment: Rare, no history of abuse   OB History    No data available     Review of Systems  Constitutional: Positive for fatigue. Negative for fever and activity change.  HENT: Negative for congestion and rhinorrhea.   Respiratory: Negative for cough, chest tightness and shortness of breath.   Cardiovascular: Positive for palpitations. Negative for chest pain and leg swelling.  Gastrointestinal: Negative for  nausea, vomiting and abdominal pain.  Genitourinary: Negative for dysuria, vaginal bleeding and vaginal discharge.  Musculoskeletal: Negative for myalgias and arthralgias.  Skin: Negative for rash.  Neurological: Positive for dizziness and light-headedness.  A complete 10 system review of systems was obtained and all systems are negative except as noted in the HPI and PMH.      Allergies  Review of patient's allergies indicates no known allergies.  Home Medications   Prior to Admission medications   Medication Sig Start Date End Date Taking? Authorizing Provider  CALCIUM-VITAMIN D PO Take 1 tablet by mouth every other day.   Yes Historical Provider, MD  chlorpheniramine (CHLOR-TRIMETON) 4 MG tablet Take 4 mg by mouth 2 (two) times daily as needed for allergies.   Yes Historical Provider, MD  Coenzyme Q10 (COQ10 PO) Take 1 capsule by mouth daily.   Yes Historical Provider, MD  diphenhydrAMINE (BENADRYL) 25 MG tablet Take 25 mg by mouth every 6 (six) hours as needed for allergies.   Yes Historical Provider, MD  GARLIC PO Take 1 tablet by mouth daily.   Yes Historical Provider, MD  MAGNESIUM PO Take 1 tablet by mouth daily.   Yes Historical Provider, MD  metoprolol succinate (TOPROL XL) 25 MG 24 hr tablet Take 1 tablet (25 mg total) by mouth daily. 04/30/14  Yes Vesta Mixer, MD  OVER THE COUNTER MEDICATION Take 1 tablet by mouth daily. NATTO KINASE FOR BP   Yes  Historical Provider, MD  tetrahydrozoline 0.05 % ophthalmic solution Place 1 drop into both eyes as needed (FOR DRY EYES).   Yes Historical Provider, MD   BP 115/81 mmHg  Pulse 102  Temp(Src) 98.6 F (37 C) (Oral)  Resp 18  Ht  (1.575 m)  Wt 235 lb 8 oz (106.822 kg)  BMI 43.06 kg/m2  SpO2 96% Physical Exam  Constitutional: She is oriented to person, place, and time. She appears well-developed and well-nourished. No distress.  Anxious appearing  HENT:  Head: Normocephalic and atraumatic.  Mouth/Throat: Oropharynx is  clear and moist. No oropharyngeal exudate.  Eyes: Conjunctivae and EOM are normal. Pupils are equal, round, and reactive to light.  Neck: Normal range of motion. Neck supple.  No meningismus.  Cardiovascular: Normal rate, normal heart sounds and intact distal pulses.   No murmur heard. Regular tachycardia  Pulmonary/Chest: Effort normal and breath sounds normal. No respiratory distress.  Abdominal: Soft. There is no tenderness. There is no rebound and no guarding.  Musculoskeletal: Normal range of motion. She exhibits no edema or tenderness.  Neurological: She is alert and oriented to person, place, and time. No cranial nerve deficit. She exhibits normal muscle tone. Coordination normal.  No ataxia on finger to nose bilaterally. No pronator drift. 5/5 strength throughout. CN 2-12 intact. Negative Romberg. Equal grip strength. Sensation intact. Gait is normal.   Skin: Skin is warm.  Psychiatric: She has a normal mood and affect. Her behavior is normal.  Nursing note and vitals reviewed.   ED Course  CARDIOVERSION Date/Time: 07/08/2014 6:15 PM Performed by: Glynn Octave Authorized by: Glynn Octave Consent: The procedure was performed in an emergent situation. Verbal consent obtained. Risks and benefits: risks, benefits and alternatives were discussed Consent given by: patient Patient understanding: patient states understanding of the procedure being performed Patient consent: the patient's understanding of the procedure matches consent given Procedure consent: procedure consent matches procedure scheduled Relevant documents: relevant documents present and verified Test results: test results available and properly labeled Site marked: the operative site was marked Imaging studies: imaging studies available Patient identity confirmed: provided demographic data and verbally with patient Time out: Immediately prior to procedure a "time out" was called to verify the correct patient,  procedure, equipment, support staff and site/side marked as required. Cardioversion basis: emergent Pre-procedure rhythm: supraventricular tachycardia Patient position: patient was placed in a supine position Chest area: chest area exposed Electrodes: pads Electrodes placed: anterior-posterior Number of attempts: 2 Attempt 1 outcome: conversion to other rhythm Attempt 2 outcome: conversion to other rhythm Post-procedure rhythm: atrial flutter Complications: no complications Patient tolerance: Patient tolerated the procedure well with no immediate complications Comments: Chemical cardioversion with adenosine  followed by adenosine 12 mg   (including critical care time) Labs Review Labs Reviewed  CBC WITH DIFFERENTIAL/PLATELET - Abnormal; Notable for the following:    WBC 13.8 (*)    RBC 5.71 (*)    Hemoglobin 17.7 (*)    HCT 51.5 (*)    Platelets 459 (*)    Neutro Abs 8.8 (*)    Monocytes Absolute 1.1 (*)    All other components within normal limits  BASIC METABOLIC PANEL - Abnormal; Notable for the following:    CO2 18 (*)    Glucose, Bld 143 (*)    All other components within normal limits  BRAIN NATRIURETIC PEPTIDE - Abnormal; Notable for the following:    B Natriuretic Peptide 237.5 (*)    All other components within normal limits  TSH  TROPONIN I  COMPREHENSIVE METABOLIC PANEL  TROPONIN I  TROPONIN I  LIPID PANEL  HEPARIN LEVEL (UNFRACTIONATED)  CBC  I-STAT TROPOININ, ED    Imaging Review Dg Chest Portable 1 View  07/08/2014   CLINICAL DATA:  Tachycardia. History of atrial flutter with rapid ventricular response. Initial encounter.  EXAM: PORTABLE CHEST - 1 VIEW  COMPARISON:  04/12/2014 radiographs.  FINDINGS: 1804 hours. External pacer in place. The heart size and mediastinal contours are stable allowing for slightly lower lung volumes. There is no edema, confluent airspace opacity or pleural effusion. The bones appear unremarkable. Telemetry leads overlie the  chest.  IMPRESSION: No active cardiopulmonary process.   Electronically Signed   By: Carey Bullocks M.D.   On: 07/08/2014 18:21     EKG Interpretation   Date/Time:  Thursday July 08 2014 17:32:56 EDT Ventricular Rate:  172 PR Interval:    QRS Duration: 60 QT Interval:  274 QTC Calculation: 463 R Axis:   112 Text Interpretation:  Atrial flutter with variable A-V block Right axis  deviation Marked ST abnormality, possible inferior subendocardial injury  Abnormal ECG atrial flutter versus SVT Confirmed by Manus Gunning  MD, Ignatz Deis  [phone]) on 07/08/2014 6:08:11 PM      MDM   Final diagnoses:  Atrial flutter with rapid ventricular response   Racing heart and palpitations since this morning. Outpatient EKG with likely SVT. Dr. Allyson Sabal at bedside on arrival. Adenosine given without change in rhythm. Second adenosine dose of 12 mg showed underlying atrial flutter.  Patient loaded with cardizem and started on Cardizem drip.  HR controlled with IV cardizem.  No CP or SOB.   Admission to cardiology d/w Dr. Allyson Sabal.   CRITICAL CARE Performed by: Glynn Octave Total critical care time: 30 Critical care time was exclusive of separately billable procedures and treating other patients. Critical care was necessary to treat or prevent imminent or life-threatening deterioration. Critical care was time spent personally by me on the following activities: development of treatment plan with patient and/or surrogate as well as nursing, discussions with consultants, evaluation of patient's response to treatment, examination of patient, obtaining history from patient or surrogate, ordering and performing treatments and interventions, ordering and review of laboratory studies, ordering and review of radiographic studies, pulse oximetry and re-evaluation of patient's condition.    Glynn Octave, MD 07/09/14 0130

## 2014-07-08 NOTE — Telephone Encounter (Signed)
Patient reports current all day episode of persistent, significant palpitations/pounding HR/tachycardia and elevated BP. Denies dizziness, CP, SOB, excessive caffeine or any etoh use. States current sx similar to episode in April when she was then started on Toprol XL 25 mg daily. States she had a very high family stressor last night and had difficulty going to sleep beyond her usual OSA sx.   BP and HR today = 143/119, 108 and 146/109, 100 and 122/82, 89 and 160/100, 116.   Patient had cancelled appt with Dr. Elease Hashimoto on 6/8 as "I was feeling better".  She did have the Sleep Study and has an upcoming appt in August to discuss results with Dr. Mayford Knife.   Reviewed by Tereso Newcomer, PA. Patient to take extra dose Toprol XL 25 mg today and if no improvement in sx, she should proceed to ED tonight for assessment/treatment. Patient also scheduled to see Flex provider tomorrow Norma Fredrickson, NP, at 10:30 am - 6/17).  Notified patient and she is agreeable to plan. Will take the extra dose of Toprol as soon as she gets home from work. And then keep her normal dosing on schedule.   Patient called back to inform us of new BP readings and she states her co-worker at [employer] [facility]) just did a short strip on her and will fax it over to [place] at this time. Rec'd transmission. Reviewed by Tereso Newcomer, PA. Patient is in SVT. Advised her to proceed to [facility] ED immediately and keep appt with Norma Fredrickson, NP, tomorrow for follow-up. Notified patient and she verbalized understanding and agreement with plan. Provided overview of SVT and why this is an urgent rhythm to be evaluated and treated. Patient headed to ED at this time. She will take a copy of the SVT strip with her to provide to ED MD. Triage notified Trish (Cardmaster) that patient was on her way to ED in SVT.  Will now route message to Dr. Elease Hashimoto and Norma Fredrickson, NP, as Lorain Childes. Placed strip in Lori's box with note that it needs to be scanned into pt chart.

## 2014-07-08 NOTE — ED Notes (Signed)
The patient said she was at work and was feeling her hear racing.  She denies any other symptoms.  The patient did appear diaphoretic but denies any chest pain.

## 2014-07-08 NOTE — ED Notes (Addendum)
Verbal order from Dr. Manus Gunning to administer 5mg  bolus of cardizem. 5mg  bolus given.

## 2014-07-09 ENCOUNTER — Ambulatory Visit: Payer: 59 | Admitting: Nurse Practitioner

## 2014-07-09 LAB — COMPREHENSIVE METABOLIC PANEL
ALBUMIN: 3.4 g/dL — AB (ref 3.5–5.0)
ALK PHOS: 60 U/L (ref 38–126)
ALT: 19 U/L (ref 14–54)
ANION GAP: 9 (ref 5–15)
AST: 17 U/L (ref 15–41)
BILIRUBIN TOTAL: 0.8 mg/dL (ref 0.3–1.2)
BUN: 11 mg/dL (ref 6–20)
CO2: 24 mmol/L (ref 22–32)
Calcium: 9.1 mg/dL (ref 8.9–10.3)
Chloride: 109 mmol/L (ref 101–111)
Creatinine, Ser: 0.68 mg/dL (ref 0.44–1.00)
GFR calc Af Amer: >60 mL/min (ref >60)
GFR calc non Af Amer: >60 mL/min (ref >60)
Glucose, Bld: 112 mg/dL — ABNORMAL HIGH (ref 65–99)
POTASSIUM: 3.6 mmol/L (ref 3.5–5.1)
SODIUM: 142 mmol/L (ref 135–145)
TOTAL PROTEIN: 6.4 g/dL — AB (ref 6.5–8.1)

## 2014-07-09 LAB — CBC
HCT: 45 % (ref 36.0–46.0)
Hemoglobin: 15.2 g/dL — ABNORMAL HIGH (ref 12.0–15.0)
MCH: 30.5 pg (ref 26.0–34.0)
MCHC: 33.8 g/dL (ref 30.0–36.0)
MCV: 90.4 fL (ref 78.0–100.0)
Platelets: 372 10*3/uL (ref 150–400)
RBC: 4.98 MIL/uL (ref 3.87–5.11)
RDW: 13.7 % (ref 11.5–15.5)
WBC: 12.8 10*3/uL — ABNORMAL HIGH (ref 4.0–10.5)

## 2014-07-09 LAB — LIPID PANEL
CHOL/HDL RATIO: 3.4 ratio
Cholesterol: 162 mg/dL (ref 0–200)
HDL: 48 mg/dL (ref >40)
LDL CALC: 90 mg/dL (ref 0–99)
TRIGLYCERIDES: 121 mg/dL (ref <150)
VLDL: 24 mg/dL (ref 0–40)

## 2014-07-09 LAB — HEPARIN LEVEL (UNFRACTIONATED)
Heparin Unfractionated: 0.39 IU/mL (ref 0.30–0.70)
Heparin Unfractionated: 0.33 IU/mL (ref 0.30–0.70)

## 2014-07-09 LAB — TROPONIN I: Troponin I: <0.03 ng/mL (ref <0.031)

## 2014-07-09 MED ORDER — ASPIRIN 81 MG PO TABS: 81.0000 mg | ORAL_TABLET | Freq: Every day | ORAL | Status: DC

## 2014-07-09 MED ORDER — METOPROLOL SUCCINATE ER 25 MG PO TB24: 25.0000 mg | ORAL_TABLET | Freq: Two times a day (BID) | ORAL | Status: DC

## 2014-07-09 NOTE — Discharge Summary (Signed)
CARDIOLOGY DISCHARGE SUMMARY   Patient ID: Beth Bowman MRN: [id number] DOB/AGE: June 30, 1960 54 y.o.  Admit date: 07/08/2014 Discharge date: 07/09/2014  PCP: No primary care provider on file. Primary Cardiologist: Nahser  Primary Discharge Diagnosis:  Atrial flutter, rapid ventricular response Secondary Discharge Diagnosis:    Obesity  Procedures: Adenosine administration  Hospital Course: Beth Bowman is a 54 y.o. female with a history of palpitations and normal echocardiogram. She had more frequent palpitations and was started on a beta blocker, but the palpitations were never captured on ECG. She woke with palpitations 06/16 and finally came to the hospital in the evening. She was in ?SVT that was revealed to be atrial flutter when given adenosine.   She was started on IV Cardizem and heparin and admitted for further evaluation and treatment.  She converted to SR spontaneously on the Cardizem. She had no further arrhythmias overnight. She had no chest pain, SOB or presyncope. Her white count was mildly elevated but she had no fever, cough, etc. A BNP was mildly elevated but there were no symptoms of volume overload. Her CXR was clear.    On 06/17 she was seen by Dr Royann Shivers and all data were reviewed. She ambulated without any arrhythmia or palpitations. No further inpatient workup was indicated and she is considered stable for discharge, to follow up as an outpatient. Her This patients CHA2DS2-VASc Score and unadjusted Ischemic Stroke Rate (% per year) is equal to 0.6 % stroke rate/year from a score of 1, for gender. At this time, she will be started on Aspirin only, and will follow up with Dr Elease Hashimoto. She is asked to keep a symptom diary, and let us know how often/for how long, she has palpitations. She may need an event monitor in the future, but the patient currently feels she will know if she goes out of rhythm again. Her metoprolol is increased to BID, and she will not  be discharged on Cardizem.   She will need to stop Nattokinase (OTC med) as there have been case reports of intracranial bleeding on this medication plus aspirin.  BP 112/63 mmHg  Pulse 87  Temp(Src) 97.5 F (36.4 C) (Oral)  Resp 18  Ht 5\' 2"  (1.575 m)  Wt 235 lb 7.2 oz (106.8 kg)  BMI 43.05 kg/m2  SpO2 98% PHYSICAL EXAM General: Well developed, well nourished, female in no acute distress. Head: Normocephalic, atraumatic. Neck: Supple without bruits, JVD no elevation. Lungs: Resp regular and unlabored, CTA. Heart: RRR, S1, S2, no S3, S4, or murmur; no rub. Abdomen: Soft, non-tender, non-distended, BS + x 4.  Extremities: No clubbing, cyanosis, edema.  Neuro: Alert and oriented X 3. Moves all extremities spontaneously. Psych: Normal affect. Labs:   Lab Results  Component Value Date   WBC 12.8* 07/09/2014   HGB 15.2* 07/09/2014   HCT 45.0 07/09/2014   MCV 90.4 07/09/2014   PLT 372 07/09/2014     Recent Labs Lab 07/09/14 0337  NA 142  K 3.6  CL 109  CO2 24  BUN 11  CREATININE 0.68  CALCIUM 9.1  PROT 6.4*  BILITOT 0.8  ALKPHOS 60  ALT 19  AST 17  GLUCOSE 112*    Recent Labs  07/08/14 2212 07/09/14 0337 07/09/14 0920  TROPONINI <0.03 <0.03 <0.03   Lipid Panel     Component Value Date/Time   CHOL 162 07/09/2014 0337   TRIG 121 07/09/2014 0337   HDL 48 07/09/2014 0337   CHOLHDL 3.4  07/09/2014 0337   VLDL 24 07/09/2014 0337   LDLCALC 90 07/09/2014 0337    B NATRIURETIC PEPTIDE  Date/Time Value Ref Range Status  07/08/2014 06:05 PM 237.5* 0.0 - 100.0 pg/mL Final   Radiology: Dg Chest Portable 1 View 07/08/2014   CLINICAL DATA:  Tachycardia. History of atrial flutter with rapid ventricular response. Initial encounter.  EXAM: PORTABLE CHEST - 1 VIEW  COMPARISON:  04/12/2014 radiographs.  FINDINGS: 1804 hours. External pacer in place. The heart size and mediastinal contours are stable allowing for slightly lower lung volumes. There is no edema,  confluent airspace opacity or pleural effusion. The bones appear unremarkable. Telemetry leads overlie the chest.  IMPRESSION: No active cardiopulmonary process.   Electronically Signed   By: Carey Bullocks M.D.   On: 07/08/2014 18:21   EKG: 06/16 at 17:54 ?atrial flutter vs afib, rate 212 06/16 20:15 ST, rate 102, no acute ischemic changes   FOLLOW UP PLANS AND APPOINTMENTS No Known Allergies   Medication List    STOP taking these medications        OVER THE COUNTER MEDICATION      TAKE these medications        aspirin 81 MG tablet  Take 1 tablet (81 mg total) by mouth daily.     CALCIUM-VITAMIN D PO  Take 1 tablet by mouth every other day.     CHLOR-TRIMETON 4 MG tablet  Generic drug:  chlorpheniramine  Take 4 mg by mouth 2 (two) times daily as needed for allergies.     COQ10 PO  Take 1 capsule by mouth daily.     diphenhydrAMINE 25 MG tablet  Commonly known as:  BENADRYL  Take 25 mg by mouth every 6 (six) hours as needed for allergies.     GARLIC PO  Take 1 tablet by mouth daily.     MAGNESIUM PO  Take 1 tablet by mouth daily.     metoprolol succinate 25 MG 24 hr tablet  Commonly known as:  TOPROL XL  Take 1 tablet (25 mg total) by mouth 2 (two) times daily.     tetrahydrozoline 0.05 % ophthalmic solution  Place 1 drop into both eyes as needed (FOR DRY EYES).        Discharge Instructions    Diet - low sodium heart healthy    Complete by:  As directed      Increase activity slowly    Complete by:  As directed           Follow-up Information    Follow up with Nahser, Deloris Ping, MD.   Specialty:  Cardiology   Why:  The office will call with information on a treadmill and a followup appointment.   Contact information:   [address] [place] [address] [phone]       BRING ALL MEDICATIONS WITH YOU TO FOLLOW UP APPOINTMENTS  Time spent with patient to include physician time: 39 min Signed: Theodore Demark, PA-C 07/09/2014,  11:59 AM Co-Sign MD

## 2014-07-09 NOTE — Progress Notes (Signed)
Utilization review completed.  

## 2014-07-09 NOTE — Progress Notes (Signed)
ANTICOAGULATION CONSULT NOTE - Follow Up Consult  Pharmacy Consult for heparin Indication: Aflutter   Labs:  Recent Labs  07/08/14 1738 07/08/14 2212 07/09/14 0337  HGB 17.7*  --   --   HCT 51.5*  --   --   PLT 459*  --   --   HEPARINUNFRC  --   --  0.39  CREATININE 0.83  --   --   TROPONINI  --  <0.03  --       Assessment/Plan:  54yo female therapeutic on heparin with initial dosing for Aflutter. Will continue gtt at current rate and confirm stable with additional level.   Vernard Gambles, PharmD, BCPS  07/09/2014,4:30 AM

## 2014-07-09 NOTE — Progress Notes (Signed)
ANTICOAGULATION CONSULT NOTE - Follow Up Consult  Pharmacy Consult for heparin Indication: Aflutter   Labs:  Recent Labs  07/08/14 1738 07/08/14 2212 07/09/14 0337 07/09/14 0915 07/09/14 0920  HGB 17.7*  --  15.2*  --   --   HCT 51.5*  --  45.0  --   --   PLT 459*  --  372  --   --   HEPARINUNFRC  --   --  0.39 0.33  --   CREATININE 0.83  --  0.68  --   --   TROPONINI  --  <0.03 <0.03  --  <0.03     Assessment/Plan:  54yo female therapeutic on heparin with initial dosing for Aflutter. Will continue gtt at current rate.  Daily heparin level & CBC  Thank you for allowing pharmacy to be a part of this patients care team.  Lovenia Kim Pharm.D., BCPS, AQ-Cardiology Clinical Pharmacist 07/09/2014 10:26 AM Pager: [phone] Phone: [phone]

## 2014-07-09 NOTE — Discharge Instructions (Signed)
Please keep a symptom diary of how often and for how long you have palpitations. Please bring this with you to office visits.

## 2014-07-09 NOTE — Progress Notes (Signed)
This patients CHA2DS2-VASc Score and unadjusted Ischemic Stroke Rate (% per year) is equal to 0.6 % stroke rate/year from a score of 1. Above score calculated as 1 point each if present [CHF, HTN, DM, Vascular=MI/PAD/Aortic Plaque, Age if 65-74, or Female], 2 points each if present [Age > 75, or Stroke/TIA/TE]  Theodore Demark, PA-C 07/09/2014 9:09 AM Beeper [phone]

## 2014-07-09 NOTE — Progress Notes (Signed)
S/L and monitor removed.  D/C paperwork reviewed with the patient and she voiced understanding.  Patient brought to the front door via W/C.

## 2014-07-13 ENCOUNTER — Telehealth (HOSPITAL_COMMUNITY): Payer: Self-pay

## 2014-07-13 ENCOUNTER — Other Ambulatory Visit: Payer: Self-pay | Admitting: Cardiovascular Disease

## 2014-07-13 DIAGNOSIS — I4892 Unspecified atrial flutter: Secondary | ICD-10-CM

## 2014-07-13 NOTE — Telephone Encounter (Signed)
Encounter complete. 

## 2014-07-14 ENCOUNTER — Ambulatory Visit (HOSPITAL_COMMUNITY)
Admission: RE | Admit: 2014-07-14 | Discharge: 2014-07-14 | Disposition: A | Discharge status: Home / Self Care | Payer: 59 | Source: Ambulatory Visit | Attending: Cardiovascular Disease | Admitting: Cardiovascular Disease

## 2014-07-14 DIAGNOSIS — I4892 Unspecified atrial flutter: Secondary | ICD-10-CM | POA: Diagnosis present

## 2014-07-14 LAB — EXERCISE TOLERANCE TEST
CHL CUP MPHR: 166 {beats}/min
CHL RATE OF PERCEIVED EXERTION: 16
CSEPEW: 7 METS
CSEPPHR: 176 {beats}/min
Exercise duration (min): 6 min
Percent HR: 106 %
Rest HR: 101 {beats}/min

## 2014-07-29 ENCOUNTER — Ambulatory Visit (INDEPENDENT_AMBULATORY_CARE_PROVIDER_SITE_OTHER): Payer: 59 | Admitting: Cardiovascular Disease

## 2014-07-29 ENCOUNTER — Encounter: Payer: Self-pay | Admitting: Cardiovascular Disease

## 2014-07-29 VITALS — BP 104/80 | HR 88 | Ht 62.0 in | Wt 230.6 lb

## 2014-07-29 DIAGNOSIS — G4733 Obstructive sleep apnea (adult) (pediatric): Secondary | ICD-10-CM | POA: Diagnosis not present

## 2014-07-29 DIAGNOSIS — I4892 Unspecified atrial flutter: Secondary | ICD-10-CM

## 2014-07-29 NOTE — Progress Notes (Signed)
Cardiology Office Note   Date:  07/29/2014   ID:  Beth Bowman, DOB 04-Mar-1960, MRN [id number]  PCP:  No primary care provider on file.  Cardiologist:   Ellisa Devivo, Deloris PingPhilip J, MD   Chief Complaint  Patient presents with  . Follow-up    Palpitations, 2-3 mo f/u / post sleep study   Problem List 1. Atrial flutter ( paroxysmal ) 2. Obstructive sleep apnea.    History of Present Illness: Beth Bowman is a 54 y.o. female who presents for chest pain .   Last fall, she started having some vague upper left sided chest pain.  occasionall she would have upper back pain. Also has left upper arm pain and tingling. She went to see [name] Francene Boyers( Darrell  Kalihl, MD ) , he had initially recommended an echo and then eventually recommended a GXT   She did not have insurance so she did not have the tests.    She also has palpitations for the past year She knows that she does not sleep well.  Wakes up in the early AM - has trouble getting back to sleep. Some early morning headaches. Sleepy during the day.   This past Sunday night, she had some palpitations / heart pounding after taking a nap and went to the ER.   She admits that she has been anxious about all of these symptoms   She joined the gym in the fall.  Did not have any CP or shortness of breath with exercise.  When these symptoms started up several months ago, she stopped going to the gym.   She works for [employer].  July 29, 2014:  Beth Bowman was admitted with rapid atrial flutter several weeks ago .    The diagnosis was made when she received adenosine and showed the flutter waves.   was started on metoprolol .   Seems to be better.     Past Medical History  Diagnosis Date  . Migraines   . OSA (obstructive sleep apnea) 06/22/2014    Mild with AHI 6.7/hr    Past Surgical History  Procedure Laterality Date  . Cesarean section      x4  . Tonsillectomy       Current Outpatient Prescriptions  Medication Sig  Dispense Refill  . aspirin 81 MG tablet Take 1 tablet (81 mg total) by mouth daily.    [name]. CALCIUM-VITAMIN D PO Take 1 tablet by mouth every other day.    . chlorpheniramine (CHLOR-TRIMETON) 4 MG tablet Take 4 mg by mouth 2 (two) times daily as needed for allergies.    . Coenzyme Q10 (COQ10 PO) Take 1 capsule by mouth daily.    . diphenhydrAMINE (BENADRYL) 25 MG tablet Take 25 mg by mouth every 6 (six) hours as needed for allergies.    [name]. GARLIC PO Take 1 tablet by mouth daily.    [name]. MAGNESIUM PO Take 1 tablet by mouth daily.    . metoprolol succinate (TOPROL XL) 25 MG 24 hr tablet Take 1 tablet (25 mg total) by mouth 2 (two) times daily. 62 tablet 11  . tetrahydrozoline 0.05 % ophthalmic solution Place 1 drop into both eyes as needed (FOR DRY EYES).     No current facility-administered medications for this visit.    Allergies:   Review of patient's allergies indicates no known allergies.    Social History:  The patient  reports that she has never smoked. She has never used smokeless tobacco. She reports that  she drinks alcohol. She reports that she does not use illicit drugs.   Family History:  The patient's family history includes Arrhythmia in her father.    ROS:  Please see the history of present illness.    Review of Systems: Constitutional:  denies fever, chills, diaphoresis, appetite change and fatigue.  HEENT: denies photophobia, eye pain, redness, hearing loss, ear pain, congestion, sore throat, rhinorrhea, sneezing, neck pain, neck stiffness and tinnitus.  Respiratory: admits to  DOE,   Cardiovascular: admits to chest pain, palpitations and.  Denies any  leg swelling.  Gastrointestinal: denies nausea, vomiting, abdominal pain, diarrhea, constipation, blood in stool.  Genitourinary: denies dysuria, urgency, frequency, hematuria, flank pain and difficulty urinating.  Musculoskeletal: denies  myalgias, back pain, joint swelling, arthralgias and gait problem.   Skin: denies pallor,  rash and wound.  Neurological: denies dizziness, seizures, syncope, weakness, light-headedness, numbness and headaches.   Hematological: denies adenopathy, easy bruising, personal or family bleeding history.  Psychiatric/ Behavioral: denies suicidal ideation, mood changes, confusion, nervousness, sleep disturbance and agitation.       All other systems are reviewed and negative.    PHYSICAL EXAM: VS:  BP 104/80 mmHg  Pulse 88  Ht  (1.575 m)  Wt 104.599 kg (230 lb 9.6 oz)  BMI 42.17 kg/m2  SpO2 88% , BMI Body mass index is 42.17 kg/(m^2). GEN: Well nourished, well developed, in no acute distress HEENT: normal Neck: no JVD, carotid bruits, or masses Cardiac: RRR; no murmurs, rubs, or gallops,no edema  Respiratory:  clear to auscultation bilaterally, normal work of breathing GI: soft, nontender, nondistended, + BS MS: no deformity or atrophy Skin: warm and dry, no rash Neuro:  Strength and sensation are intact Psych: normal   EKG:  EKG is ordered today. The ekg ordered today demonstrates NSR at 88 RAD, NS ST changes, no changes from previous ECG   Recent Labs: 07/08/2014: B Natriuretic Peptide 237.5*; TSH 2.790 07/09/2014: ALT 19; BUN 11; Creatinine, Ser 0.68; Hemoglobin 15.2*; Platelets 372; Potassium 3.6; Sodium 142    Lipid Panel    Component Value Date/Time   CHOL 162 07/09/2014 0337   TRIG 121 07/09/2014 0337   HDL 48 07/09/2014 0337   CHOLHDL 3.4 07/09/2014 0337   VLDL 24 07/09/2014 0337   LDLCALC 90 07/09/2014 0337      Wt Readings from Last 3 Encounters:  07/29/14 104.599 kg (230 lb 9.6 oz)  07/09/14 106.8 kg (235 lb 7.2 oz)  06/18/14 98.884 kg (218 lb)      Other studies Reviewed: Additional studies/ records that were reviewed today include: . Review of the above records demonstrates:    ASSESSMENT AND PLAN:  1.  Atrial flutter:    Was found to  have atrial flutter during a hospitalization .  Likely related to her OSA. Strongly encouraged her  to lose weight,  Control her OSA.    Discussed the possibility of catheter ablation eventually if this does not work.   2. OSA:   Will see Dr. Mayford Knife soon.  3. Obesity :    Current medicines are reviewed at length with the patient today.  The patient does not have concerns regarding medicines.  The following changes have been made:  no change  Labs/ tests ordered today include:  No orders of the defined types were placed in this encounter.     Disposition:   FU with me in  6 months .      Signed, Drina Jobst, Deloris Ping, MD  07/29/2014 5:08 PM    [facility] [address] [place], [place], [place]  [address] Phone: [phone]; Fax: [phone]

## 2014-07-29 NOTE — Patient Instructions (Signed)
Medication Instructions:  Your physician recommends that you continue on your current medications as directed. Please refer to the Current Medication list given to you today.   Labwork: None Ordered   Testing/Procedures: None Ordered   Follow-Up: Your physician wants you to follow-up in: 6 months with Dr. Nahser.  You will receive a reminder letter in the mail two months in advance. If you don't receive a letter, please call our office to schedule the follow-up appointment.     

## 2014-08-17 ENCOUNTER — Telehealth: Payer: Self-pay | Admitting: Cardiovascular Disease

## 2014-08-17 ENCOUNTER — Telehealth (HOSPITAL_COMMUNITY): Payer: Self-pay | Admitting: *Deleted

## 2014-08-17 ENCOUNTER — Ambulatory Visit (INDEPENDENT_AMBULATORY_CARE_PROVIDER_SITE_OTHER): Payer: 59 | Admitting: Nurse Practitioner

## 2014-08-17 VITALS — BP 112/80 | HR 96 | Ht 63.0 in | Wt 226.0 lb

## 2014-08-17 DIAGNOSIS — R0789 Other chest pain: Secondary | ICD-10-CM

## 2014-08-17 MED ORDER — NITROGLYCERIN 0.4 MG SL SUBL: 0.4000 mg | SUBLINGUAL_TABLET | SUBLINGUAL | Status: DC | PRN

## 2014-08-17 NOTE — Patient Instructions (Signed)
Medication Instructions:  TAKE NTG 0.4 mg sublingual as needed for chest pain - patient instructed on administration   Labwork: None Ordered  Testing/Procedures: Your physician has requested that you have a lexiscan myoview. For further information please visit [email or web address] Please follow instruction sheet, as given.   Follow-Up: As directed following myoview

## 2014-08-17 NOTE — Telephone Encounter (Signed)
Patient given detailed instructions per Myocardial Perfusion Study Information Sheet for test on 08/18/14 at 0700. Patient Notified to arrive 15 minutes early, and that it is imperative to arrive on time for appointment to keep from having the test rescheduled. Patient verbalized understanding. Vaudie Engebretsen, Adelene Idler

## 2014-08-17 NOTE — Telephone Encounter (Signed)
New message      Pt c/o of Chest Pain: STAT if CP now or developed within 24 hours  1. Are you having CP right now? no  2. Are you experiencing any other symptoms (ex. SOB, nausea, vomiting, sweating)? no  3. How long have you been experiencing CP?  Since last ov -----but seems to be increasing  4. Is your CP continuous or coming and going? On and off  5. Have you taken Nitroglycerin? no?

## 2014-08-17 NOTE — Progress Notes (Signed)
1.) Reason for visit: chest discomfort, worse over past several days  2.) Name of MD requesting visit: Dr. Elease Hashimoto  3.) H&P: Hx atrial flutter with RVR converted at ED with adenosine; c/o atypical chest pain, worse recently  4.) ROS related to problem: patient complains of chest heaviness, discomfort in midsternal region, radiating to left shoulder, left arm and left breast at various times.  States the pain is not constant and is not worse with exertion.  Rates pain 5-6, states it is never excruciating; states at times she feels that she cannot take a deep breath; denies diaphoresis; c/o occasional nausea  States she was awakened last night with pain followed by nausea and diarrhea; states at times she gets flushed feeling across her chest; states has hx of using PPI             without relief but has not used one recently  5.) Assessment and plan per MD: per Dr. Elease Hashimoto, schedule lexiscan myoview and prescribe NTG 0.4 SL to use as needed for chest pain

## 2014-08-17 NOTE — Telephone Encounter (Signed)
Having the same type of chest discomfort as she described to  Dr. Elease Hashimoto on OV 07/29/14.  Today patient describes pain waking her up last night.  Woke at 230am had chest pain burning sensation upper body.  C/o today that her pulse was slightly irregular HR 80's.  B/P  128/86.  No chest pain now or shortness of breath. Recently admitted to hospital for rapid atrial flutter. Converted to SR.  Started on metoprolol succinate 25 mg daily   Discussed with Tereso Newcomer APP PA-C.  Instructed patient to come in for EKG today

## 2014-08-17 NOTE — Telephone Encounter (Signed)
I see that she is coming in to have an ECg I would add Prilosec or Pepcid at night if she is found to have no significant arrhythmias on eCG

## 2014-08-18 ENCOUNTER — Ambulatory Visit (HOSPITAL_COMMUNITY): Payer: 59 | Attending: Cardiology

## 2014-08-18 ENCOUNTER — Telehealth: Payer: Self-pay

## 2014-08-18 VITALS — Ht 63.0 in | Wt 226.0 lb

## 2014-08-18 DIAGNOSIS — R0789 Other chest pain: Secondary | ICD-10-CM | POA: Diagnosis not present

## 2014-08-18 DIAGNOSIS — R0602 Shortness of breath: Secondary | ICD-10-CM

## 2014-08-18 MED ORDER — REGADENOSON 0.4 MG/5ML IV SOLN
0.4000 mg | Freq: Once | INTRAVENOUS | Status: AC
  Administered 2014-08-18: 0.4 mg via INTRAVENOUS

## 2014-08-18 MED ORDER — AMINOPHYLLINE 25 MG/ML IV SOLN: 75.0000 mg | Freq: Two times a day (BID) | INTRAVENOUS | Status: AC | PRN

## 2014-08-18 MED ORDER — TECHNETIUM TC 99M SESTAMIBI GENERIC - CARDIOLITE
32.8000 | Freq: Once | INTRAVENOUS | Status: AC | PRN
  Administered 2014-08-18: 32.8 via INTRAVENOUS

## 2014-08-18 NOTE — Telephone Encounter (Signed)
Told patient to start taking Prilosec or Pepcid at night before bed Patient said she would start taking Prilosec OTC at night before bed

## 2014-08-19 ENCOUNTER — Ambulatory Visit (HOSPITAL_COMMUNITY): Payer: 59 | Attending: Cardiology

## 2014-08-19 LAB — MYOCARDIAL PERFUSION IMAGING
LV sys vol: 24 mL
LVDIAVOL: 70 mL
Peak HR: 110 {beats}/min
RATE: 0.22
Rest HR: 67 {beats}/min
SDS: 2
SRS: 0
SSS: 2
TID: 0.89

## 2014-08-19 MED ORDER — TECHNETIUM TC 99M SESTAMIBI GENERIC - CARDIOLITE
32.8000 | Freq: Once | INTRAVENOUS | Status: AC | PRN
  Administered 2014-08-19: 32.8 via INTRAVENOUS

## 2014-08-26 ENCOUNTER — Ambulatory Visit: Payer: 59 | Admitting: Cardiovascular Disease

## 2014-09-01 NOTE — Progress Notes (Signed)
Cardiology Office Note   Date:  09/01/2014   ID:  Beth Bowman, DOB 1960-03-28, MRN [id number]  PCP:  PROVIDER NOT IN SYSTEM    No chief complaint on file.     History of Present Illness: Beth Bowman is a 54 y.o. female who presents for evaluation of OSA,. SHe recently underwent PSG for excessive daytime sleepiness with an Epworth sleepiness scale of 12.  Her PSG showed very mild OSA with an AHI of 6.7/hr and the lowest oxygen saturation with respiratory events was 87%.  She had mild snoring.  She says that she is having a lot of issues related to menopause.  She has been having palpitations, difficulty sleeping and hot flashes.      Past Medical History  Diagnosis Date  . Migraines   . OSA (obstructive sleep apnea) 06/22/2014    Mild with AHI 6.7/hr    Past Surgical History  Procedure Laterality Date  . Cesarean section      x4  . Tonsillectomy       Current Outpatient Prescriptions  Medication Sig Dispense Refill  . aspirin 81 MG tablet Take 1 tablet (81 mg total) by mouth daily.    [name] CALCIUM-VITAMIN D PO Take 1 tablet by mouth every other day.    . chlorpheniramine (CHLOR-TRIMETON) 4 MG tablet Take 4 mg by mouth 2 (two) times daily as needed for allergies.    . Coenzyme Q10 (COQ10 PO) Take 1 capsule by mouth daily.    . diphenhydrAMINE (BENADRYL) 25 MG tablet Take 25 mg by mouth every 6 (six) hours as needed for allergies.    [name] GARLIC PO Take 1 tablet by mouth daily.    [name] MAGNESIUM PO Take 1 tablet by mouth daily.    . metoprolol succinate (TOPROL XL) 25 MG 24 hr tablet Take 1 tablet (25 mg total) by mouth 2 (two) times daily. 62 tablet 11  . nitroGLYCERIN (NITROSTAT) 0.4 MG SL tablet Place 1 tablet (0.4 mg total) under the tongue every 5 (five) minutes as needed for chest pain. 25 tablet 6  . tetrahydrozoline 0.05 % ophthalmic solution Place 1 drop into both eyes as needed (FOR DRY EYES).     No current facility-administered medications  for this visit.   Facility-Administered Medications Ordered in Other Visits  Medication Dose Route Frequency Provider Last Rate Last Dose  . aminophylline injection 75 mg  75 mg Intravenous BID PRN Laurey Morale, MD        Allergies:   Review of patient's allergies indicates no known allergies.    Social History:  The patient  reports that she has never smoked. She has never used smokeless tobacco. She reports that she drinks alcohol. She reports that she does not use illicit drugs.   Family History:  The patient's family history includes Arrhythmia in her father.    ROS:  Please see the history of present illness.   Otherwise, review of systems are positive for none.   All other systems are reviewed and negative.    PHYSICAL EXAM: VS:  There were no vitals taken for this visit. , BMI There is no weight on file to calculate BMI. GEN: Well nourished, well developed, in no acute distress HEENT: normal Neck: no JVD, carotid bruits, or masses Cardiac: RRR; no murmurs, rubs, or gallops,no edema  Respiratory:  clear to auscultation bilaterally, normal work of breathing  GI: soft, nontender, nondistended, + BS MS: no deformity or atrophy Skin: warm and dry, no rash Neuro:  Strength and sensation are intact Psych: euthymic mood, full affect   EKG:  EKG is not ordered today.    Recent Labs: 07/08/2014: B Natriuretic Peptide 237.5*; TSH 2.790 07/09/2014: ALT 19; BUN 11; Creatinine, Ser 0.68; Hemoglobin 15.2*; Platelets 372; Potassium 3.6; Sodium 142    Lipid Panel    Component Value Date/Time   CHOL 162 07/09/2014 0337   TRIG 121 07/09/2014 0337   HDL 48 07/09/2014 0337   CHOLHDL 3.4 07/09/2014 0337   VLDL 24 07/09/2014 0337   LDLCALC 90 07/09/2014 0337      Wt Readings from Last 3 Encounters:  08/18/14 226 lb (102.513 kg)  08/17/14 226 lb (102.513 kg)  07/29/14 230 lb 9.6 oz (104.599 kg)       ASSESSMENT AND PLAN:  1.  Very mild OSA with an AHI of 6.7/hr mainly in  the non supine position during NREM sleep.  I have discussed the various treatment options with her including, a trial of CPAP therapy, oral device or referral to ENT for evaluation of surgical causes of OSA.  Given the very mild degree of OSA and minimal oxygen desaturations, I'm not sure she will gain much benefit symptomatically from CPAP.  I have recommended a trial of weight loss in conjunction with referral to dentistry for oral device and then repeat home sleep study.   2.  Obesity - I have encouraged her to try to get into an exercise regimen along with a diet low in carbs and fats.   Current medicines are reviewed at length with the patient today.  The patient does not have concerns regarding medicines.  The following changes have been made:  no change  Labs/ tests ordered today: See above Assessment and Plan No orders of the defined types were placed in this encounter.     Disposition:   FU with me in 3 months   Signed, Quintella Reichert, MD  09/01/2014 6:36 PM    [facility] [address] [place], [place], [place]  [address] Phone: [phone]; Fax: [phone]

## 2014-09-02 ENCOUNTER — Encounter: Payer: Self-pay | Admitting: Cardiology

## 2014-09-02 ENCOUNTER — Ambulatory Visit (INDEPENDENT_AMBULATORY_CARE_PROVIDER_SITE_OTHER): Payer: 59 | Admitting: Cardiology

## 2014-09-02 VITALS — BP 110/70 | HR 81 | Ht 63.0 in | Wt 213.0 lb

## 2014-09-02 DIAGNOSIS — E669 Obesity, unspecified: Secondary | ICD-10-CM

## 2014-09-02 DIAGNOSIS — G4733 Obstructive sleep apnea (adult) (pediatric): Secondary | ICD-10-CM

## 2014-09-02 NOTE — Patient Instructions (Signed)
Medication Instructions:  Your physician recommends that you continue on your current medications as directed. Please refer to the Current Medication list given to you today.   Labwork: None  Testing/Procedures: None   Follow-Up: You have been referred to Dr. Irene Limbo for evaluation of oral device for treatment of OSA.  Any Other Special Instructions Will Be Listed Below (If Applicable).

## 2014-12-24 ENCOUNTER — Telehealth: Payer: Self-pay | Admitting: Cardiovascular Disease

## 2014-12-24 ENCOUNTER — Ambulatory Visit (INDEPENDENT_AMBULATORY_CARE_PROVIDER_SITE_OTHER): Payer: 59 | Admitting: Cardiology

## 2014-12-24 ENCOUNTER — Encounter: Payer: Self-pay | Admitting: Cardiology

## 2014-12-24 VITALS — BP 118/78 | HR 72 | Ht 63.0 in | Wt 226.0 lb

## 2014-12-24 DIAGNOSIS — G4733 Obstructive sleep apnea (adult) (pediatric): Secondary | ICD-10-CM

## 2014-12-24 DIAGNOSIS — R0789 Other chest pain: Secondary | ICD-10-CM | POA: Diagnosis not present

## 2014-12-24 DIAGNOSIS — R350 Frequency of micturition: Secondary | ICD-10-CM

## 2014-12-24 DIAGNOSIS — I4892 Unspecified atrial flutter: Secondary | ICD-10-CM | POA: Diagnosis not present

## 2014-12-24 DIAGNOSIS — R002 Palpitations: Secondary | ICD-10-CM

## 2014-12-24 LAB — BASIC METABOLIC PANEL
BUN: 14 mg/dL (ref 7–25)
CALCIUM: 10.1 mg/dL (ref 8.6–10.4)
CO2: 26 mmol/L (ref 20–31)
CREATININE: 0.62 mg/dL (ref 0.50–1.05)
Chloride: 104 mmol/L (ref 98–110)
GLUCOSE: 107 mg/dL — AB (ref 65–99)
Potassium: 4.5 mmol/L (ref 3.5–5.3)
Sodium: 141 mmol/L (ref 135–146)

## 2014-12-24 LAB — CBC WITH DIFFERENTIAL/PLATELET
BASOS PCT: 0 % (ref 0–1)
Basophils Absolute: 0 10*3/uL (ref 0.0–0.1)
EOS PCT: 0 % (ref 0–5)
Eosinophils Absolute: 0 10*3/uL (ref 0.0–0.7)
HCT: 46.9 % — ABNORMAL HIGH (ref 36.0–46.0)
Hemoglobin: 15.8 g/dL — ABNORMAL HIGH (ref 12.0–15.0)
Lymphocytes Relative: 25 % (ref 12–46)
Lymphs Abs: 2.4 10*3/uL (ref 0.7–4.0)
MCH: 30.2 pg (ref 26.0–34.0)
MCHC: 33.7 g/dL (ref 30.0–36.0)
MCV: 89.5 fL (ref 78.0–100.0)
MONO ABS: 0.7 10*3/uL (ref 0.1–1.0)
MONOS PCT: 7 % (ref 3–12)
MPV: 9.7 fL (ref 8.6–12.4)
Neutro Abs: 6.4 10*3/uL (ref 1.7–7.7)
Neutrophils Relative %: 68 % (ref 43–77)
Platelets: 406 10*3/uL — ABNORMAL HIGH (ref 150–400)
RBC: 5.24 MIL/uL — AB (ref 3.87–5.11)
RDW: 13.9 % (ref 11.5–15.5)
WBC: 9.4 10*3/uL (ref 4.0–10.5)

## 2014-12-24 LAB — HEMOGLOBIN A1C
Hgb A1c MFr Bld: 5.8 % — ABNORMAL HIGH (ref <5.7)
MEAN PLASMA GLUCOSE: 120 mg/dL — AB (ref <117)

## 2014-12-24 LAB — HEMOGLOBIN: Hemoglobin: 15.8 g/dL — ABNORMAL HIGH (ref 12.0–15.0)

## 2014-12-24 NOTE — Telephone Encounter (Signed)
Pt was seen in clinic. Still having CP ECG shows TWI anteriorly Will set up for cath

## 2014-12-24 NOTE — Telephone Encounter (Signed)
Spoke with patient who states over the past few weeks has had some episodes of feeling like she has irregular heart rate and dizziness; states it is worse today.  She states she is at home today and was looking forward to a day off but has not felt well since she woke up.  States she took her metoprolol at approximately 0700 and rested a while longer, when she got up to make breakfast she felt very light-headed and thought she might pass out.  She has home pulse and blood pressure monitor and states both readings were below normal earlier today.  States currently BP is 130/92 mmHg and HR 78.  States her heart rate feels irregular.  I scheduled her to see our FLEX APP today, Nada BoozerLaura Ingold at 3:00.  She states her son can drive her to the appointment.  I advised her to take an additional 1/2 Metoprolol and try to eat something and that if she feels poor when she arrives, to have the person at check in to call me.  She verbalized understanding and agreement and thanked me for the call.

## 2014-12-24 NOTE — Progress Notes (Signed)
Cardiology Office Note   Date:  12/24/2014   ID:  RHETTA CLEEK, DOB 1960/08/05, MRN [id number]  PCP:  PROVIDER NOT IN SYSTEM  Cardiologist:  Dr. Elease Hashimoto      Chief Complaint  Patient presents with  . Palpitations  . Dizziness      History of Present Illness: Beth Bowman is a 54 y.o. female who presents for palpitations and dizziness, and chest pain.  She has a hx of PAFlutter and was seen in Er 06/2014.  She converted spontaneously to SR.   CHA2DS2-VASc Score and unadjusted Ischemic Stroke Rate (% per year) is equal to 0.6 % stroke rate/year from a score of 1, for gender so placed on ASA only.     Since that time she has had echo with normal EF and G1DD.  A lexiscan myoview for her chest pain and abnormal EKG with inverted T waves in her ant leads was normal.    Today she is tearful with ongoing issues, she stated this began with menopause.  Currently with palpitations- skips, that are occurring more frequently.  She also if very dizzy-lightheaded at times.  She has tried to walk but these last few weeks develops chest pain when she does.  So she stops and pain does improve.  All of this is increasing her anxiety.  She has not sen her GYN in over 18 months.  She continues to try and loose wt.   Other complaints of freq. Urination, some lower abd discomfort with urination that comes and goes.     Past Medical History  Diagnosis Date  . Migraines   . OSA (obstructive sleep apnea) 06/22/2014    Mild with AHI 6.7/hr    Past Surgical History  Procedure Laterality Date  . Cesarean section      x4  . Tonsillectomy       Current Outpatient Prescriptions  Medication Sig Dispense Refill  . aspirin 81 MG tablet Take 1 tablet (81 mg total) by mouth daily.    [name] CALCIUM-VITAMIN D PO Take 1 tablet by mouth every other day.    . chlorpheniramine (CHLOR-TRIMETON) 4 MG tablet Take 4 mg by mouth 2 (two) times daily as needed for allergies.    . Coenzyme Q10 (COQ10 PO) Take 1  capsule by mouth daily.    . diphenhydrAMINE (BENADRYL) 25 MG tablet Take 25 mg by mouth every 6 (six) hours as needed for allergies.    [name] GARLIC PO Take 1 tablet by mouth daily.    [name] MAGNESIUM PO Take 1 tablet by mouth daily.    . metoprolol succinate (TOPROL XL) 25 MG 24 hr tablet Take 1 tablet (25 mg total) by mouth 2 (two) times daily. 62 tablet 11  . nitroGLYCERIN (NITROSTAT) 0.4 MG SL tablet Place 0.4 mg under the tongue every 5 (five) minutes as needed for chest pain (x 3 doses).    [name] tetrahydrozoline 0.05 % ophthalmic solution Place 1 drop into both eyes as needed (FOR DRY EYES).     No current facility-administered medications for this visit.   Facility-Administered Medications Ordered in Other Visits  Medication Dose Route Frequency Provider Last Rate Last Dose  . aminophylline injection 75 mg  75 mg Intravenous BID PRN Laurey Morale, MD        Allergies:   Review of patient's allergies indicates no known allergies.    Social History:  The patient  reports that she has never smoked. She has never used smokeless  tobacco. She reports that she drinks alcohol. She reports that she does not use illicit drugs.   Family History:  The patient's family history includes Arrhythmia in her father.    ROS:  General:no colds or fevers, no weight changes Skin:no rashes or ulcers HEENT:no blurred vision, no congestion CV:see HPI PUL:see HPI GI:no diarrhea constipation or melena, no indigestion GU:no hematuria, no dysuria- freq urination  MS:no joint pain, no claudication Neuro:no syncope, + lightheadedness Endo:no diabetes, no thyroid disease  Wt Readings from Last 3 Encounters:  12/24/14 226 lb (102.513 kg)  09/02/14 213 lb (96.616 kg)  08/18/14 226 lb (102.513 kg)     PHYSICAL EXAM: VS:  BP 118/78 mmHg  Pulse 72  Ht 5\' 3"  (1.6 m)  Wt 226 lb (102.513 kg)  BMI 40.04 kg/m2 , BMI Body mass index is 40.04 kg/(m^2). General:Pleasant affect, NAD- though tearful Skin:Warm and  dry, brisk capillary refill HEENT:normocephalic, sclera clear, mucus membranes moist Neck:supple, no JVD, no bruits  Heart:S1S2 RRR without murmur, gallup, rub or click Lungs:clear without rales, rhonchi, or wheezes ZOX:WRUEAbd:soft, non tender, + BS, do not palpate liver spleen or masses Ext:no lower ext edema, 2+ pedal pulses, 2+ radial pulses Neuro:alert and oriented X 3, MAE, follows commands, + facial symmetry    EKG:  EKG is ordered today. The ekg ordered today demonstrates SR , T wave inversion in V1-3, same as previous.     Recent Labs: 07/08/2014: B Natriuretic Peptide 237.5*; TSH 2.790 07/09/2014: ALT 19; BUN 11; Creatinine, Ser 0.68; Hemoglobin 15.2*; Platelets 372; Potassium 3.6; Sodium 142    Lipid Panel    Component Value Date/Time   CHOL 162 07/09/2014 0337   TRIG 121 07/09/2014 0337   HDL 48 07/09/2014 0337   CHOLHDL 3.4 07/09/2014 0337   VLDL 24 07/09/2014 0337   LDLCALC 90 07/09/2014 0337       Other studies Reviewed: Additional studies/ records that were reviewed today include: previous studies, ov notes, hospitalization.    ASSESSMENT AND PLAN:  1.  Chest pain with continued abnormal EKG.  Discussed with Dr. Elease HashimotoNahser- plan will be for cardiac cath to know for sure of CAD.  Hopefully will be normal.  Discussed risks The patient understands that risks included but are not limited to stroke (1 in 1000), death (1 in 1000), kidney failure [usually temporary] (1 in 500), bleeding (1 in 200), allergic reaction [possibly serious] (1 in 200). Dr. Elease HashimotoNahser discussed with pt also  2. Dizziness, increased -orthostatic BP Lying 131/79 p-72;  Sitting 113/71 p 77  Standing 115/74 p 86 - will not increase the metoprolol for now.    3. Palpitations, may be runs of PAF which may be causing chest pain.  Will add 48 hour event monitor to eval.    4.  Possible UTI- with freq urination will check u/a  5.  Possible DM will check hgA1c  6. Post menopausal will need follow up with GYN     She will follow up in 2-3 weeks with me post cath.      Current medicines are reviewed with the patient today.  The patient Has no concerns regarding medicines.  The following changes have been made:  See above Labs/ tests ordered today include:see above  Disposition:   FU:  see above  Nyoka LintSigned, INGOLD,LAURA R, NP  12/24/2014 3:18 PM    [facility] [address], [place], [place]  [address]/ [address] [place], [place] Phone: [phone];  Fax: [phone]  [phone]

## 2014-12-24 NOTE — Patient Instructions (Addendum)
Medication Instructions:  Your physician recommends that you continue on your current medications as directed. Please refer to the Current Medication list given to you today.   Labwork: Your physician recommends that you have lab work today: bmet/cbc/ptt/pt/inr/HGB/A1C/tsh/urine culture/urinalysis   Testing/Procedures: Your physician has requested that you have a cardiac catheterization. Cardiac catheterization is used to diagnose and/or treat various heart conditions. Doctors may recommend this procedure for a number of different reasons. The most common reason is to evaluate chest pain. Chest pain can be a symptom of coronary artery disease (CAD), and cardiac catheterization can show whether plaque is narrowing or blocking your heart's arteries. This procedure is also used to evaluate the valves, as well as measure the blood flow and oxygen levels in different parts of your heart. For further information please visit [email or web address]. Please follow instruction sheet, as given.    Follow-Up: Your physician recommends that you keep your scheduled  follow-up appointment with Nada BoozerLaura Ingold, NP   Any Other Special Instructions Will Be Listed Below (If Applicable).  You are scheduled for a cardiac catheterization on Monday, December 5 with Dr. Eldridge DaceVaranasi  or associate.  [facility] on Monday, December 5 at 8:30 am.  Enter thru the [address] No food or drink after midnight on Sunday, December 4. You may take your medications with a sip of water on the day of your procedure.  You will need someone to drive you home after procedure.     If you need a refill on your cardiac medications before your next appointment, please call your pharmacy.

## 2014-12-24 NOTE — Telephone Encounter (Signed)
New problem   Pt is having irregular heartbeat and want to come in the office to have an EKG. Pt is lightheaded and tried to get a BP reading but keep getting an error. Please advise pt.

## 2014-12-25 LAB — PROTIME-INR
INR: 1 (ref <1.50)
PROTHROMBIN TIME: 13.3 s (ref 11.6–15.2)

## 2014-12-25 LAB — URINALYSIS
BILIRUBIN URINE: NEGATIVE
GLUCOSE, UA: NEGATIVE
Hgb urine dipstick: NEGATIVE
Ketones, ur: NEGATIVE
Nitrite: NEGATIVE
PROTEIN: NEGATIVE
SPECIFIC GRAVITY, URINE: 1.009 (ref 1.001–1.035)
pH: 8 (ref 5.0–8.0)

## 2014-12-25 LAB — TSH: TSH: 1.233 u[IU]/mL (ref 0.350–4.500)

## 2014-12-25 LAB — APTT: APTT: 31 s (ref 24–37)

## 2014-12-26 LAB — URINE CULTURE: Colony Count: 40000

## 2014-12-27 ENCOUNTER — Ambulatory Visit (HOSPITAL_COMMUNITY)
Admission: RE | Admit: 2014-12-27 | Discharge: 2014-12-27 | Disposition: A | Discharge status: Home / Self Care | Payer: 59 | Source: Ambulatory Visit | Attending: Interventional Cardiology | Admitting: Interventional Cardiology

## 2014-12-27 ENCOUNTER — Encounter (HOSPITAL_COMMUNITY): Payer: Self-pay | Admitting: Interventional Cardiology

## 2014-12-27 ENCOUNTER — Other Ambulatory Visit: Payer: Self-pay | Admitting: Cardiology

## 2014-12-27 ENCOUNTER — Encounter (HOSPITAL_COMMUNITY)
Admission: RE | Disposition: A | Discharge status: Home / Self Care | Payer: 59 | Source: Ambulatory Visit | Attending: Interventional Cardiology

## 2014-12-27 DIAGNOSIS — R002 Palpitations: Secondary | ICD-10-CM

## 2014-12-27 DIAGNOSIS — I4892 Unspecified atrial flutter: Secondary | ICD-10-CM

## 2014-12-27 DIAGNOSIS — R0789 Other chest pain: Secondary | ICD-10-CM | POA: Diagnosis present

## 2014-12-27 DIAGNOSIS — I209 Angina pectoris, unspecified: Secondary | ICD-10-CM | POA: Insufficient documentation

## 2014-12-27 DIAGNOSIS — R9431 Abnormal electrocardiogram [ECG] [EKG]: Secondary | ICD-10-CM | POA: Diagnosis not present

## 2014-12-27 DIAGNOSIS — R42 Dizziness and giddiness: Secondary | ICD-10-CM

## 2014-12-27 HISTORY — PX: CARDIAC CATHETERIZATION: SHX172

## 2014-12-27 SURGERY — LEFT HEART CATH AND CORONARY ANGIOGRAPHY
Anesthesia: LOCAL

## 2014-12-27 MED ORDER — SODIUM CHLORIDE 0.9 % IJ SOLN: 3.0000 mL | INTRAMUSCULAR | Status: DC | PRN

## 2014-12-27 MED ORDER — MIDAZOLAM HCL 2 MG/2ML IJ SOLN
INTRAMUSCULAR | Status: DC | PRN
  Administered 2014-12-27: 1 mg via INTRAVENOUS
  Administered 2014-12-27: 2 mg via INTRAVENOUS

## 2014-12-27 MED ORDER — FENTANYL CITRATE (PF) 100 MCG/2ML IJ SOLN
INTRAMUSCULAR | Status: DC | PRN
  Administered 2014-12-27 (×2): 25 ug via INTRAVENOUS

## 2014-12-27 MED ORDER — SODIUM CHLORIDE 0.9 % WEIGHT BASED INFUSION
3.0000 mL/kg/h | INTRAVENOUS | Status: AC
  Administered 2014-12-27: 3 mL/kg/h via INTRAVENOUS

## 2014-12-27 MED ORDER — VERAPAMIL HCL 2.5 MG/ML IV SOLN
INTRAVENOUS | Status: DC | PRN
  Administered 2014-12-27: 11:00:00 via INTRA_ARTERIAL

## 2014-12-27 MED ORDER — MIDAZOLAM HCL 2 MG/2ML IJ SOLN
INTRAMUSCULAR | Status: AC
  Filled 2014-12-27: qty 2

## 2014-12-27 MED ORDER — SODIUM CHLORIDE 0.9 % IJ SOLN: 3.0000 mL | Freq: Two times a day (BID) | INTRAMUSCULAR | Status: DC

## 2014-12-27 MED ORDER — SODIUM CHLORIDE 0.9 % WEIGHT BASED INFUSION
1.0000 mL/kg/h | INTRAVENOUS | Status: DC
  Administered 2014-12-27: 1 mL/kg/h via INTRAVENOUS

## 2014-12-27 MED ORDER — SODIUM CHLORIDE 0.9 % WEIGHT BASED INFUSION: 1.0000 mL/kg/h | INTRAVENOUS | Status: DC

## 2014-12-27 MED ORDER — LIDOCAINE HCL (PF) 1 % IJ SOLN
INTRAMUSCULAR | Status: AC
  Filled 2014-12-27: qty 30

## 2014-12-27 MED ORDER — ASPIRIN 81 MG PO CHEW
CHEWABLE_TABLET | ORAL | Status: AC
  Filled 2014-12-27: qty 1

## 2014-12-27 MED ORDER — FENTANYL CITRATE (PF) 100 MCG/2ML IJ SOLN
INTRAMUSCULAR | Status: AC
  Filled 2014-12-27: qty 2

## 2014-12-27 MED ORDER — HEPARIN SODIUM (PORCINE) 1000 UNIT/ML IJ SOLN
INTRAMUSCULAR | Status: AC
  Filled 2014-12-27: qty 1

## 2014-12-27 MED ORDER — HEPARIN (PORCINE) IN NACL 2-0.9 UNIT/ML-% IJ SOLN
INTRAMUSCULAR | Status: AC
  Filled 2014-12-27: qty 1000

## 2014-12-27 MED ORDER — SODIUM CHLORIDE 0.9 % IV SOLN: 250.0000 mL | INTRAVENOUS | Status: DC | PRN

## 2014-12-27 MED ORDER — VERAPAMIL HCL 2.5 MG/ML IV SOLN
INTRAVENOUS | Status: AC
  Filled 2014-12-27: qty 2

## 2014-12-27 MED ORDER — HEPARIN SODIUM (PORCINE) 1000 UNIT/ML IJ SOLN
INTRAMUSCULAR | Status: DC | PRN
  Administered 2014-12-27: 5000 [IU] via INTRAVENOUS

## 2014-12-27 MED ORDER — IOHEXOL 350 MG/ML SOLN
INTRAVENOUS | Status: DC | PRN
  Administered 2014-12-27: 40 mL via INTRA_ARTERIAL

## 2014-12-27 MED ORDER — ASPIRIN 81 MG PO CHEW
81.0000 mg | CHEWABLE_TABLET | ORAL | Status: AC
  Administered 2014-12-27: 81 mg via ORAL

## 2014-12-27 SURGICAL SUPPLY — 11 items

## 2014-12-27 NOTE — Discharge Instructions (Signed)
Radial Site Care °Refer to this sheet in the next few weeks. These instructions provide you with information about caring for yourself after your procedure. Your health care provider may also give you more specific instructions. Your treatment has been planned according to current medical practices, but problems sometimes occur. Call your health care provider if you have any problems or questions after your procedure. °WHAT TO EXPECT AFTER THE PROCEDURE °After your procedure, it is typical to have the following: °· Bruising at the radial site that usually fades within 1-2 weeks. °· Blood collecting in the tissue (hematoma) that may be painful to the touch. It should usually decrease in size and tenderness within 1-2 weeks. °HOME CARE INSTRUCTIONS °· Take medicines only as directed by your health care provider. °· You may shower 24-48 hours after the procedure or as directed by your health care provider. Remove the bandage (dressing) and gently wash the site with plain soap and water. Pat the area dry with a clean towel. Do not rub the site, because this may cause bleeding. °· Do not take baths, swim, or use a hot tub until your health care provider approves. °· Check your insertion site every day for redness, swelling, or drainage. °· Do not apply powder or lotion to the site. °· Do not flex or bend the affected arm for 24 hours or as directed by your health care provider. °· Do not push or pull heavy objects with the affected arm for 24 hours or as directed by your health care provider. °· Do not lift over 10 lb (4.5 kg) for 5 days after your procedure or as directed by your health care provider. °· Ask your health care provider when it is okay to: °¨ Return to work or school. °¨ Resume usual physical activities or sports. °¨ Resume sexual activity. °· Do not drive home if you are discharged the same day as the procedure. Have someone else drive you. °· You may drive 24 hours after the procedure unless otherwise  instructed by your health care provider. °· Do not operate machinery or power tools for 24 hours after the procedure. °· If your procedure was done as an outpatient procedure, which means that you went home the same day as your procedure, a responsible adult should be with you for the first 24 hours after you arrive home. °· Keep all follow-up visits as directed by your health care provider. This is important. °SEEK MEDICAL CARE IF: °· You have a fever. °· You have chills. °· You have increased bleeding from the radial site. Hold pressure on the site. °SEEK IMMEDIATE MEDICAL CARE IF: °· You have unusual pain at the radial site. °· You have redness, warmth, or swelling at the radial site. °· You have drainage (other than a small amount of blood on the dressing) from the radial site. °· The radial site is bleeding, and the bleeding does not stop after 30 minutes of holding steady pressure on the site. °· Your arm or hand becomes pale, cool, tingly, or numb. °  °This information is not intended to replace advice given to you by your health care provider. Make sure you discuss any questions you have with your health care provider. °  °Document Released: 02/10/2010 Document Revised: 01/29/2014 Document Reviewed: 07/27/2013 °Elsevier Interactive Patient Education ©2016 Elsevier Inc. ° °

## 2014-12-27 NOTE — H&P (Signed)
    Reviewed notes from Dr. Elease HashimotoNahser and Nada BoozerLaura Ingold.  Discusssed procedure with patient.  All questons  Answered.    Cath Lab Visit (complete for each Cath Lab visit)  Clinical Evaluation Leading to the Procedure:   ACS: Yes.    Non-ACS:    Anginal Classification: CCS IV  Anti-ischemic medical therapy: Minimal Therapy (1 class of medications)  Non-Invasive Test Results: Low-risk stress test findings: cardiac mortality <1%/year  Prior CABG: No previous CABG   Beth Bowman has presented today for surgery, with the diagnosis of unstable angina The various methods of treatment have been discussed with the patient and family. After consideration of risks, benefits and other options for treatment, the patient has consented to Procedure(s): Left Heart Cath and Coronary Angiography (N/A) as a surgical intervention . The patient's history has been reviewed, patient examined, no change in status, stable for surgery. I have reviewed the patient's chart and labs. Questions were answered to the patient's satisfaction.    Corky CraftsJayadeep S Varanasi, MD

## 2014-12-28 ENCOUNTER — Ambulatory Visit (INDEPENDENT_AMBULATORY_CARE_PROVIDER_SITE_OTHER): Payer: 59

## 2014-12-28 DIAGNOSIS — R42 Dizziness and giddiness: Secondary | ICD-10-CM | POA: Diagnosis not present

## 2014-12-28 DIAGNOSIS — R002 Palpitations: Secondary | ICD-10-CM | POA: Diagnosis not present

## 2014-12-28 DIAGNOSIS — R0789 Other chest pain: Secondary | ICD-10-CM

## 2014-12-28 DIAGNOSIS — I4892 Unspecified atrial flutter: Secondary | ICD-10-CM | POA: Diagnosis not present

## 2014-12-29 MED FILL — Lidocaine HCl Local Preservative Free (PF) Inj 1%: INTRAMUSCULAR | Qty: 30 | Status: AC

## 2014-12-29 MED FILL — Heparin Sodium (Porcine) 2 Unit/ML in Sodium Chloride 0.9%: INTRAMUSCULAR | Qty: 500 | Status: AC

## 2015-01-07 ENCOUNTER — Emergency Department (HOSPITAL_COMMUNITY)
Admission: EM | Admit: 2015-01-07 | Discharge: 2015-01-07 | Disposition: A | Discharge status: Home / Self Care | Payer: 59 | Attending: Emergency Medicine | Admitting: Emergency Medicine

## 2015-01-07 ENCOUNTER — Emergency Department (HOSPITAL_COMMUNITY): Payer: 59

## 2015-01-07 ENCOUNTER — Telehealth: Payer: Self-pay | Admitting: *Deleted

## 2015-01-07 DIAGNOSIS — I480 Paroxysmal atrial fibrillation: Secondary | ICD-10-CM | POA: Diagnosis not present

## 2015-01-07 DIAGNOSIS — Z7982 Long term (current) use of aspirin: Secondary | ICD-10-CM | POA: Diagnosis not present

## 2015-01-07 DIAGNOSIS — Z8679 Personal history of other diseases of the circulatory system: Secondary | ICD-10-CM | POA: Insufficient documentation

## 2015-01-07 DIAGNOSIS — R000 Tachycardia, unspecified: Secondary | ICD-10-CM | POA: Diagnosis present

## 2015-01-07 DIAGNOSIS — F419 Anxiety disorder, unspecified: Secondary | ICD-10-CM | POA: Diagnosis not present

## 2015-01-07 LAB — CBC WITH DIFFERENTIAL/PLATELET
BASOS ABS: 0.1 10*3/uL (ref 0.0–0.1)
BASOS PCT: 1 %
EOS ABS: 0.1 10*3/uL (ref 0.0–0.7)
EOS PCT: 1 %
HCT: 47.9 % — ABNORMAL HIGH (ref 36.0–46.0)
Hemoglobin: 15.6 g/dL — ABNORMAL HIGH (ref 12.0–15.0)
Lymphocytes Relative: 23 %
Lymphs Abs: 2 10*3/uL (ref 0.7–4.0)
MCH: 30.5 pg (ref 26.0–34.0)
MCHC: 32.6 g/dL (ref 30.0–36.0)
MCV: 93.7 fL (ref 78.0–100.0)
MONO ABS: 0.8 10*3/uL (ref 0.1–1.0)
MONOS PCT: 8 %
Neutro Abs: 6.1 10*3/uL (ref 1.7–7.7)
Neutrophils Relative %: 67 %
Platelets: 394 10*3/uL (ref 150–400)
RBC: 5.11 MIL/uL (ref 3.87–5.11)
RDW: 13.7 % (ref 11.5–15.5)
WBC: 9 10*3/uL (ref 4.0–10.5)

## 2015-01-07 LAB — URINE MICROSCOPIC-ADD ON

## 2015-01-07 LAB — I-STAT TROPONIN, ED: Troponin i, poc: 0 ng/mL (ref 0.00–0.08)

## 2015-01-07 LAB — COMPREHENSIVE METABOLIC PANEL
ALBUMIN: 3.9 g/dL (ref 3.5–5.0)
ALK PHOS: 69 U/L (ref 38–126)
ALT: 20 U/L (ref 14–54)
AST: 18 U/L (ref 15–41)
Anion gap: 11 (ref 5–15)
BILIRUBIN TOTAL: 0.8 mg/dL (ref 0.3–1.2)
BUN: 13 mg/dL (ref 6–20)
CO2: 24 mmol/L (ref 22–32)
Calcium: 9.8 mg/dL (ref 8.9–10.3)
Chloride: 108 mmol/L (ref 101–111)
Creatinine, Ser: 0.75 mg/dL (ref 0.44–1.00)
GFR calc Af Amer: >60 mL/min (ref >60)
GFR calc non Af Amer: >60 mL/min (ref >60)
GLUCOSE: 123 mg/dL — AB (ref 65–99)
POTASSIUM: 3.8 mmol/L (ref 3.5–5.1)
SODIUM: 143 mmol/L (ref 135–145)
TOTAL PROTEIN: 7 g/dL (ref 6.5–8.1)

## 2015-01-07 LAB — URINALYSIS, ROUTINE W REFLEX MICROSCOPIC
BILIRUBIN URINE: NEGATIVE
Glucose, UA: NEGATIVE mg/dL
KETONES UR: NEGATIVE mg/dL
Leukocytes, UA: NEGATIVE
NITRITE: NEGATIVE
PROTEIN: NEGATIVE mg/dL
SPECIFIC GRAVITY, URINE: 1.007 (ref 1.005–1.030)
pH: 7 (ref 5.0–8.0)

## 2015-01-07 LAB — TSH: TSH: 1.061 u[IU]/mL (ref 0.350–4.500)

## 2015-01-07 MED ORDER — METOPROLOL SUCCINATE ER 50 MG PO TB24: 50.0000 mg | ORAL_TABLET | Freq: Two times a day (BID) | ORAL | Status: DC

## 2015-01-07 MED ORDER — FLECAINIDE ACETATE 100 MG PO TABS
300.0000 mg | ORAL_TABLET | Freq: Once | ORAL | Status: AC
  Administered 2015-01-07: 300 mg via ORAL
  Filled 2015-01-07: qty 3

## 2015-01-07 MED ORDER — DILTIAZEM HCL 25 MG/5ML IV SOLN
10.0000 mg | Freq: Once | INTRAVENOUS | Status: AC
  Administered 2015-01-07: 10 mg via INTRAVENOUS

## 2015-01-07 MED ORDER — SODIUM CHLORIDE 0.9 % IV BOLUS (SEPSIS)
500.0000 mL | Freq: Once | INTRAVENOUS | Status: AC
  Administered 2015-01-07: 500 mL via INTRAVENOUS

## 2015-01-07 MED ORDER — APIXABAN 5 MG PO TABS: 5.0000 mg | ORAL_TABLET | Freq: Two times a day (BID) | ORAL | Status: DC

## 2015-01-07 MED ORDER — DILTIAZEM HCL 100 MG IV SOLR
5.0000 mg/h | Freq: Once | INTRAVENOUS | Status: AC
Start: 2015-01-07 — End: 2015-01-07
  Administered 2015-01-07: 5 mg/h via INTRAVENOUS
  Filled 2015-01-07: qty 100

## 2015-01-07 MED ORDER — LORAZEPAM 2 MG/ML IJ SOLN
0.5000 mg | Freq: Once | INTRAMUSCULAR | Status: AC
  Administered 2015-01-07: 0.5 mg via INTRAVENOUS
  Filled 2015-01-07: qty 1

## 2015-01-07 MED ORDER — APIXABAN 5 MG PO TABS
5.0000 mg | ORAL_TABLET | Freq: Once | ORAL | Status: AC
  Administered 2015-01-07: 5 mg via ORAL
  Filled 2015-01-07: qty 1

## 2015-01-07 NOTE — Telephone Encounter (Signed)
Informed pt of monitor results. While speaking with her she said that this morning she felt like she was out of rhythm and felt jittery. Checked vitals this AM and was 124/97, HR 126 then, 120/107, HR 107. Pt states that she took her medicines about 45 mins prior to calling. Spoke with Azalee CourseHao Meng, PA-C and he said to have pt wait about an hour for medication to kick in and if she was still feeling like this she could take an extra half tab of her Metoprolol. Wynema BirchHao also said that if pt feels like she is going to pass out that she would need to go to ED. Spoke with pt and provided her with information per Azalee CourseHao Meng, PA-C. Pt verbalized understanding and was in agreement with this plan.

## 2015-01-07 NOTE — Telephone Encounter (Signed)
-----   Message from Vesta MixerPhilip J Nahser, MD sent at 01/06/2015 10:16 AM EST ----- nsr with occasional sinus bradycardia

## 2015-01-07 NOTE — ED Notes (Signed)
Paged cardiology concerning Flecanide- if cardizem drip should be d/ced with pill on board. Waiting return page. Gabe  RN taking over pt's care and will take verbal order

## 2015-01-07 NOTE — ED Provider Notes (Signed)
Patient accepted in sign out pending cardiology evaluation.  Cardiology recommended discharge on Eliquis with increase in Metoprolol but did not want her to go home on Flecainide at this time.  The patient converted in the ER after treatment which was initiated earlier.  She was given her first dose of Eliquis.  Her prescriptions were provided as instructed and ordered by cardiology.  She was evaluated at bedside and felt well and was comfortable with plan.  She was well appearing and given strict return precautions that she and her husband expressed understanding and agreement with.  Patient was discharged home in stable condition.  She will follow up closely with cardiology.  Leta BaptistEmily Roe Sacoya Mcgourty, MD 01/08/15 [phone]

## 2015-01-07 NOTE — Discharge Instructions (Signed)
You were seen today for your atrial fibrillation.  Take all medications as prescribed by cardiology and follow up with them outpatient as soon as possible.  Return with sudden chest pain, shortness of breath, loss of consciousness, or any other new concerning symptoms.  Atrial Fibrillation Atrial fibrillation is a type of irregular or rapid heartbeat (arrhythmia). In atrial fibrillation, the heart quivers continuously in a chaotic pattern. This occurs when parts of the heart receive disorganized signals that make the heart unable to pump blood normally. This can increase the risk for stroke, heart failure, and other heart-related conditions. There are different types of atrial fibrillation, including:  Paroxysmal atrial fibrillation. This type starts suddenly, and it usually stops on its own shortly after it starts.  Persistent atrial fibrillation. This type often lasts longer than a week. It may stop on its own or with treatment.  Long-lasting persistent atrial fibrillation. This type lasts longer than 12 months.  Permanent atrial fibrillation. This type does not go away. Talk with your health care provider to learn about the type of atrial fibrillation that you have. CAUSES This condition is caused by some heart-related conditions or procedures, including:  A heart attack.  Coronary artery disease.  Heart failure.  Heart valve conditions.  High blood pressure.  Inflammation of the sac that surrounds the heart (pericarditis).  Heart surgery.  Certain heart rhythm disorders, such as Wolf-Parkinson-White syndrome. Other causes include:  Pneumonia.  Obstructive sleep apnea.  Blockage of an artery in the lungs (pulmonary embolism, or PE).  Lung cancer.  Chronic lung disease.  Thyroid problems, especially if the thyroid is overactive (hyperthyroidism).  Caffeine.  Excessive alcohol use or illegal drug use.  Use of some medicines, including certain decongestants and diet  pills. Sometimes, the cause cannot be found. RISK FACTORS This condition is more likely to develop in:  People who are older in age.  People who smoke.  People who have diabetes mellitus.  People who are overweight (obese).  Athletes who exercise vigorously. SYMPTOMS Symptoms of this condition include:  A feeling that your heart is beating rapidly or irregularly.  A feeling of discomfort or pain in your chest.  Shortness of breath.  Sudden light-headedness or weakness.  Getting tired easily during exercise. In some cases, there are no symptoms. DIAGNOSIS Your health care provider may be able to detect atrial fibrillation when taking your pulse. If detected, this condition may be diagnosed with:  An electrocardiogram (ECG).  A Holter monitor test that records your heartbeat patterns over a 24-hour period.  Transthoracic echocardiogram (TTE) to evaluate how blood flows through your heart.  Transesophageal echocardiogram (TEE) to view more detailed images of your heart.  A stress test.  Imaging tests, such as a CT scan or chest X-ray.  Blood tests. TREATMENT The main goals of treatment are to prevent blood clots from forming and to keep your heart beating at a normal rate and rhythm. The type of treatment that you receive depends on many factors, such as your underlying medical conditions and how you feel when you are experiencing atrial fibrillation. This condition may be treated with:  Medicine to slow down the heart rate, bring the heart's rhythm back to normal, or prevent clots from forming.  Electrical cardioversion. This is a procedure that resets your heart's rhythm by delivering a controlled, low-energy shock to the heart through your skin.  Different types of ablation, such as catheter ablation, catheter ablation with pacemaker, or surgical ablation. These procedures destroy the  heart tissues that send abnormal signals. When the pacemaker is used, it is placed  under your skin to help your heart beat in a regular rhythm. HOME CARE INSTRUCTIONS  Take over-the counter and prescription medicines only as told by your health care provider.  If your health care provider prescribed a blood-thinning medicine (anticoagulant), take it exactly as told. Taking too much blood-thinning medicine can cause bleeding. If you do not take enough blood-thinning medicine, you will not have the protection that you need against stroke and other problems.  Do not use tobacco products, including cigarettes, chewing tobacco, and e-cigarettes. If you need help quitting, ask your health care provider.  If you have obstructive sleep apnea, manage your condition as told by your health care provider.  Do not drink alcohol.  Do not drink beverages that contain caffeine, such as coffee, soda, and tea.  Maintain a healthy weight. Do not use diet pills unless your health care provider approves. Diet pills may make heart problems worse.  Follow diet instructions as told by your health care provider.  Exercise regularly as told by your health care provider.  Keep all follow-up visits as told by your health care provider. This is important. PREVENTION  Avoid drinking beverages that contain caffeine or alcohol.  Avoid certain medicines, especially medicines that are used for breathing problems.  Avoid certain herbs and herbal medicines, such as those that contain ephedra or ginseng.  Do not use illegal drugs, such as cocaine and amphetamines.  Do not smoke.  Manage your high blood pressure. SEEK MEDICAL CARE IF:  You notice a change in the rate, rhythm, or strength of your heartbeat.  You are taking an anticoagulant and you notice increased bruising.  You tire more easily when you exercise or exert yourself. SEEK IMMEDIATE MEDICAL CARE IF:  You have chest pain, abdominal pain, sweating, or weakness.  You feel nauseous.  You notice blood in your vomit, bowel  movement, or urine.  You have shortness of breath.  You suddenly have swollen feet and ankles.  You feel dizzy.  You have sudden weakness or numbness of the face, arm, or leg, especially on one side of the body.  You have trouble speaking, trouble understanding, or both (aphasia).  Your face or your eyelid droops on one side. These symptoms may represent a serious problem that is an emergency. Do not wait to see if the symptoms will go away. Get medical help right away. Call your local emergency services (911 in the [place] Do not drive yourself to the hospital.   This information is not intended to replace advice given to you by your health care provider. Make sure you discuss any questions you have with your health care provider.   Document Released: 01/08/2005 Document Revised: 09/29/2014 Document Reviewed: 05/05/2014 Elsevier Interactive Patient Education [employer].

## 2015-01-07 NOTE — ED Provider Notes (Signed)
CSN: [id number]     Arrival date & time 01/07/15  1145 History   First MD Initiated Contact with Patient 01/07/15 1152     Chief Complaint  Patient presents with  . Tachycardia     (Consider location/radiation/quality/duration/timing/severity/associated sxs/prior Treatment) HPI Patient has a history of paroxysmal a flutter not on any anticoagulants. Has acute onset palpitations described as a regular heartbeat starting around 7 AM. Had intermittent anxiety, lightheadedness since onset of symptoms. Denies any chest pain or shortness of breath. No new lower extremity swelling or pain. Recent cardiac catheterization with normal coronary arteries. Past Medical History  Diagnosis Date  . Migraines   . OSA (obstructive sleep apnea) 06/22/2014    Mild with AHI 6.7/hr   Past Surgical History  Procedure Laterality Date  . Cesarean section      x4  . Tonsillectomy    . Cardiac catheterization N/A 12/27/2014    Procedure: Left Heart Cath and Coronary Angiography;  Surgeon: Corky CraftsJayadeep S Varanasi, MD;  [facility];  Service: Cardiovascular;  Laterality: N/A;   Family History  Problem Relation Age of Onset  . Arrhythmia Father    Social History  Substance Use Topics  . Smoking status: Never Smoker   . Smokeless tobacco: Never Used  . Alcohol Use: 0.0 oz/week    0 Standard drinks or equivalent per week     Comment: Rare, no history of abuse   OB History    No data available     Review of Systems  Constitutional: Negative for fever and chills.  Respiratory: Negative for shortness of breath.   Cardiovascular: Positive for palpitations. Negative for chest pain and leg swelling.  Gastrointestinal: Negative for nausea, vomiting and abdominal pain.  Musculoskeletal: Negative for back pain and neck pain.  Skin: Negative for rash and wound.  Neurological: Positive for dizziness and light-headedness. Negative for syncope, weakness and headaches.  Psychiatric/Behavioral: The  patient is nervous/anxious.   All other systems reviewed and are negative.     Allergies  Review of patient's allergies indicates no known allergies.  Home Medications   Prior to Admission medications   Medication Sig Start Date End Date Taking? Authorizing Provider  acetaminophen (TYLENOL) 500 MG tablet Take 1,000 mg by mouth every 8 (eight) hours as needed for mild pain or headache.   Yes Historical Provider, MD  aspirin 81 MG tablet Take 1 tablet (81 mg total) by mouth daily. 07/09/14  Yes Rhonda G Barrett, PA-C  Coenzyme Q10 (COQ10 PO) Take 1 capsule by mouth daily.   Yes Historical Provider, MD  GARLIC PO Take 1 tablet by mouth daily.   Yes Historical Provider, MD  MAGNESIUM PO Take 1 tablet by mouth daily.   Yes Historical Provider, MD  nitroGLYCERIN (NITROSTAT) 0.4 MG SL tablet Place 0.4 mg under the tongue every 5 (five) minutes as needed for chest pain (x 3 doses).   Yes Historical Provider, MD  tetrahydrozoline 0.05 % ophthalmic solution Place 1 drop into both eyes daily as needed (FOR DRY EYES).    Yes Historical Provider, MD  apixaban (ELIQUIS) 5 MG TABS tablet Take 1 tablet (5 mg total) by mouth 2 (two) times daily. 01/07/15   Leta BaptistEmily Roe Nguyen, MD  metoprolol succinate (TOPROL-XL) 50 MG 24 hr tablet Take 1 tablet (50 mg total) by mouth 2 (two) times daily. 01/07/15   Leta BaptistEmily Roe Nguyen, MD   BP 109/79 mmHg  Pulse 81  Temp(Src) 98.2 F (36.8 C) (Oral)  Resp  18  SpO2 95% Physical Exam  Constitutional: She is oriented to person, place, and time. She appears well-developed and well-nourished. No distress.  Anxious appearing  HENT:  Head: Normocephalic and atraumatic.  Mouth/Throat: Oropharynx is clear and moist.  Eyes: EOM are normal. Pupils are equal, round, and reactive to light.  Neck: Normal range of motion. Neck supple.  Cardiovascular:  Tachycardia. Irregularly irregular  Pulmonary/Chest: Effort normal and breath sounds normal. No respiratory distress. She has no  wheezes. She has no rales. She exhibits no tenderness.  Abdominal: Soft. Bowel sounds are normal. She exhibits no distension and no mass. There is no tenderness. There is no rebound and no guarding.  Musculoskeletal: Normal range of motion. She exhibits no edema or tenderness.  No calf swelling or tenderness.  Neurological: She is alert and oriented to person, place, and time.  Skin: Skin is warm and dry. No rash noted. No erythema.  Psychiatric: Her behavior is normal.  Nursing note and vitals reviewed.   ED Course  Procedures (including critical care time) Labs Review Labs Reviewed  CBC WITH DIFFERENTIAL/PLATELET - Abnormal; Notable for the following:    Hemoglobin 15.6 (*)    HCT 47.9 (*)    All other components within normal limits  COMPREHENSIVE METABOLIC PANEL - Abnormal; Notable for the following:    Glucose, Bld 123 (*)    All other components within normal limits  URINALYSIS, ROUTINE W REFLEX MICROSCOPIC (NOT AT [facility]) - Abnormal; Notable for the following:    APPearance CLOUDY (*)    Hgb urine dipstick TRACE (*)    All other components within normal limits  URINE MICROSCOPIC-ADD ON - Abnormal; Notable for the following:    Squamous Epithelial / LPF 0-5 (*)    Bacteria, UA RARE (*)    All other components within normal limits  TSH  I-STAT TROPOININ, ED    Imaging Review No results found. I have personally reviewed and evaluated these images and lab results as part of my medical decision-making.   EKG Interpretation   Date/Time:  Friday January 07 2015 11:51:43 EST Ventricular Rate:  160 PR Interval:    QRS Duration: 73 QT Interval:  278 QTC Calculation: 453 R Axis:   100 Text Interpretation:  Atrial fibrillation Right axis deviation Low  voltage, precordial leads Repolarization abnormality, prob rate related  Confirmed by Cannon Arreola  MD, Cassadi Purdie ([id number]) on 01/07/2015 12:10:17 PM      MDM   Final diagnoses:  Paroxysmal atrial fibrillation with rapid  ventricular response (HCC)   Improved heart rate on Cardizem. Discussed with cardiology and will see patient in the emergency department.   Cardiology bedside. Will give flecainide and observe in the emergency department several hours to see if the patient converts to normal sinus rhythm. Dr Elease Hashimoto will return to assess and disposition patient. Signed out to oncoming emergency physician.  Loren Racer, MD 01/11/15 [phone]

## 2015-01-07 NOTE — ED Notes (Signed)
Pt assisted to bathroom w/ moderately steady gait. No shortness of breath or dizziness while ambulating.

## 2015-01-07 NOTE — H&P (Signed)
History and Physical   Patient ID: Beth StakesJill L Zidek MRN: [id number], DOB/AGE: 03/09/1960 54 y.o. Date of Encounter: 01/07/2015  Primary Physician: PROVIDER NOT IN SYSTEM Primary Cardiologist: Dr Elease HashimotoNahser  Chief Complaint:  Rapid atrial fib  HPI: Beth Bowman is a 54 y.o. female with a history of nl EF w/ G1DD by echo 03/2014, no sig CAD and nl EF by cath 12/27/2014, PAFlutter, CHADS2VASC=1, OSA & migraines.   She has been having palpitations. In November, she had them most days. They were generally brief and her home BP machine did not show an irregular pulse all the time. Occasionally made her light-headed or SOB.   Cath and echo were OK. 48 hour Holter showed PVCs, PACs and sinus brady, lowest HR 57.   Today, she had palpitations starting about 7:30 am. They made her feel nervous and jittery. A little light-headed with exertion/standing. She took her am metoprolol and about 11 am took an extra 1/2 tab as previously directed. When her symptoms did not improve, she got a rhythm strip>>rapid afib>>sent to ER.   In the ER, her HR is slowing on IV Cardizem and she feels better now.   She has cut back on caffeine, has had none since yesterday. She has had more chocolate than usual. She has had no cold medications recently. No stimulants, drugs or ETOH. TSH has been normal in the past, recheck pending.  She wonders about stress causing the afib, has had palpitations at times after stressful episode, but that does not apply to today.   Past Medical History  Diagnosis Date  . Migraines   . OSA (obstructive sleep apnea) 06/22/2014    Mild with AHI 6.7/hr    Surgical History:  Past Surgical History  Procedure Laterality Date  . Cesarean section      x4  . Tonsillectomy    . Cardiac catheterization N/A 12/27/2014    Procedure: Left Heart Cath and Coronary Angiography;  Surgeon: Corky CraftsJayadeep S Varanasi, MD;  [facility];  Service: Cardiovascular;  Laterality: N/A;       I have reviewed the patient's current medications. Medication Sig  acetaminophen (TYLENOL) 500 MG tablet Take 1,000 mg by mouth every 8 (eight) hours as needed for mild pain or headache.  aspirin 81 MG tablet Take 1 tablet (81 mg total) by mouth daily.  chlorpheniramine (CHLOR-TRIMETON) 4 MG tablet Take 4 mg by mouth 2 (two) times daily as needed for allergies.  Coenzyme Q10 (COQ10 PO) Take 1 capsule by mouth daily.  diphenhydrAMINE (BENADRYL) 25 MG tablet Take 25 mg by mouth every 6 (six) hours as needed for allergies.  GARLIC PO Take 1 tablet by mouth daily.  MAGNESIUM PO Take 1 tablet by mouth daily.  metoprolol succinate (TOPROL XL) 25 MG 24 hr tablet Take 1 tablet (25 mg total) by mouth 2 (two) times daily.  nitroGLYCERIN (NITROSTAT) 0.4 MG SL tablet Place 0.4 mg under the tongue every 5 (five) minutes as needed for chest pain (x 3 doses).  tetrahydrozoline 0.05 % ophthalmic solution Place 1 drop into both eyes as needed (FOR DRY EYES).   Scheduled Meds: Continuous Infusions: PRN Meds:.  Allergies: No Known Allergies  Social History   Social History  . Marital Status: Single    Spouse Name: N/A  . Number of Children: N/A  . Years of Education: N/A   Occupational History  . [employer]    Social History Main Topics  . Smoking status: Never  Smoker   . Smokeless tobacco: Never Used  . Alcohol Use: 0.0 oz/week    0 Standard drinks or equivalent per week     Comment: Rare, no history of abuse  . Drug Use: No  . Sexual Activity: Not on file   Other Topics Concern  . Not on file   Social History Narrative   Lives in [place] with her husband.    Family History  Problem Relation Age of Onset  . Arrhythmia Father    Family Status  Relation Status Death Age  . Mother Deceased 60    No premature CAD, no history of arrhythmia  . Father Deceased     No premature CAD, possible arrhythmia but no further details available    Review of Systems:   Full 14-point review of  systems otherwise negative except as noted above.  Physical Exam: Blood pressure 128/72, pulse 136, temperature 98.2 F (36.8 C), temperature source Oral, resp. rate 20, SpO2 100 %. General: Well developed, well nourished,female in no acute distress. Head: Normocephalic, atraumatic, sclera non-icteric, no xanthomas, nares are without discharge. Dentition:  Neck: No carotid bruits. JVD not elevated. No thyromegally Lungs: Good expansion bilaterally. without wheezes or rhonchi.  Heart: IRRegular rate and rhythm with S1 S2.  No S3 or S4.  No murmur, no rubs, or gallops appreciated. Abdomen: Soft, non-tender, non-distended with normoactive bowel sounds. No hepatomegaly. No rebound/guarding. No obvious abdominal masses. Msk:  Strength and tone appear normal for age. No joint deformities or effusions, no spine or costo-vertebral angle tenderness. Extremities: No clubbing or cyanosis. No edema.  Distal pedal pulses are 2+ in 4 extrem Neuro: Alert and oriented X 3. Moves all extremities spontaneously. No focal deficits noted. Psych:  Responds to questions appropriately with a normal affect. Skin: No rashes or lesions noted  Labs:   Lab Results  Component Value Date   WBC 9.0 01/07/2015   HGB 15.6* 01/07/2015   HCT 47.9* 01/07/2015   MCV 93.7 01/07/2015   PLT 394 01/07/2015     Recent Labs Lab 01/07/15 1203  NA 143  K 3.8  CL 108  CO2 24  BUN 13  CREATININE 0.75  CALCIUM 9.8  PROT 7.0  BILITOT 0.8  ALKPHOS 69  ALT 20  AST 18  GLUCOSE 123*    Recent Labs  01/07/15 1227  TROPIPOC 0.00    Radiology/Studies: Dg Chest Port 1 View 01/07/2015  CLINICAL DATA:  54 year old female with acute palpitations. Atrial flutter. Initial encounter. EXAM: PORTABLE CHEST 1 VIEW COMPARISON:  07/08/2014 and earlier. FINDINGS: Portable AP semi upright view at 1210 hours. Normal cardiac size and mediastinal contours. Stable lung volumes. Large body habitus. Allowing for portable technique, the  lungs are clear. Visualized tracheal air column is within normal limits. No pneumothorax. IMPRESSION: Negative, no acute cardiopulmonary abnormality. Electronically Signed   By: Odessa Fleming M.D.   On: 01/07/2015 12:26     Cardiac Cath: 12/27/2014  The left ventricular systolic function is normal.  No significant coronary artery disease. Continue preventive therapy.  Echo: 04/15/2014 - Left ventricle: The cavity size was normal. Wall thickness was normal. Systolic function was normal. The estimated ejection fraction was in the range of 60% to 65%. Wall motion was normal; there were no regional wall motion abnormalities. Features are consistent with a pseudonormal left ventricular filling pattern, with concomitant abnormal relaxation and increased filling pressure (grade 2 diastolic dysfunction). - Left atrium: The atrium was mildly dilated.  ECG: 01/07/2015 Atrial fib,  rate 160 No acute changes  ASSESSMENT AND PLAN:  Principal Problem:   Paroxysmal atrial fibrillation with rapid ventricular response (HCC) - rate improving on IV Cardizem - however, resting bradycardia when in SR makes adding CCB or increasing BB difficult - nl EF and cors at recent cath - palpitations are fairly frequent at times, multiple times in a week. - consider Flecainide challenge and if it works, could d/c home on a standing dose of Flecainide.  - If daily Flecainide, would then need GXT in 7-10 days - if does not convert, consider starting Flecainide and possible DCCV in am. - if afib continues to be a problem, consider EP eval.    Anticoagulation - CHADS2VASC=1 (Female) - do not think systemic anticoagulation is needed  Tawny Asal 01/07/2015 1:54 PM Beeper [phone]    Attending Note:   The patient was seen and examined.  Agree with assessment and plan as noted above.  Changes made to the above note as needed.  Beth Bowman is in atrial fib .   Started this am. Felt  palpitations Took metoprolol ,  Is on Dilt drip currently and HR is better Will give Flecainide challenge  She may be able to go home today    I would favor continuing Eliquis for about a month  (despite low  CHADS2VASC score of 1)     Vesta Mixer, Montez Hageman., MD, [facility] 01/07/2015, 2:28 PM [address]  [address] Office [phone] Pager [phone]

## 2015-01-11 ENCOUNTER — Telehealth: Payer: Self-pay | Admitting: Cardiovascular Disease

## 2015-01-11 MED ORDER — ALPRAZOLAM ER 0.5 MG PO TB24: 0.5000 mg | ORAL_TABLET | Freq: Two times a day (BID) | ORAL | Status: DC | PRN

## 2015-01-11 MED ORDER — FLECAINIDE ACETATE 50 MG PO TABS: 50.0000 mg | ORAL_TABLET | Freq: Two times a day (BID) | ORAL | Status: DC

## 2015-01-11 NOTE — Telephone Encounter (Signed)
Left detailed message on patient's voice mail regarding appointment date and time for GXT with Nada BoozerLaura Ingold, NP.  I left detailed pre-test instructions and advised her to call back with questions or concerns.

## 2015-01-11 NOTE — Telephone Encounter (Signed)
New message     Pt was recently discharged from hosp last Friday. She want to know if the dec 27th appt is also her hosp follow up appt; she is getting a migrain and is on a blood thinner--what can she take; and she has a question about her medications

## 2015-01-11 NOTE — Telephone Encounter (Signed)
Spoke with patient who called with multiple questions about her medications.  She was discharged from the hospital Friday after converting from afib with flecainide infusion.  She wants to know what she can take for a migraine.  Dr. Elease HashimotoNahser reviewed her chart and discussed with Dr. Elberta Fortisamnitz, EP, who advised that she start Flecainide 50 mg BID, continue Eliquis 5 mg BID, and continue Metoprolol 50 mg BID.  I advised her that she can stop her daily ASA 81 mg.  I advised that she may take an occasional ASA or NSAID for migraine if Tylenol is not effective.  Per Dr. Elease HashimotoNahser, she will need a GXT 1-2 weeks after starting flecainide.  I advised her that I will work on scheduling this and call her back.  She c/o anxiety and difficulty resting since the diagnosis of a fib.  I answered all of her questions to her satisfaction and advised that Dr. Elease HashimotoNahser will prescribe #20 xanax to help her with anxiety but that future refills will need to come from a family medical doctor.  She verbalized understanding and agreement and thanked me for the call.

## 2015-01-18 ENCOUNTER — Other Ambulatory Visit: Payer: Self-pay | Admitting: *Deleted

## 2015-01-18 ENCOUNTER — Encounter: Payer: 59 | Admitting: Cardiology

## 2015-01-18 ENCOUNTER — Ambulatory Visit (INDEPENDENT_AMBULATORY_CARE_PROVIDER_SITE_OTHER): Payer: 59

## 2015-01-18 DIAGNOSIS — I480 Paroxysmal atrial fibrillation: Secondary | ICD-10-CM

## 2015-01-19 LAB — EXERCISE TOLERANCE TEST
CHL CUP RESTING HR STRESS: 73 {beats}/min
CHL CUP STRESS STAGE 1 GRADE: 0 %
CHL CUP STRESS STAGE 1 HR: 82 {beats}/min
CHL CUP STRESS STAGE 1 SBP: 121 mmHg
CHL CUP STRESS STAGE 1 SPEED: 0 mph
CHL CUP STRESS STAGE 2 GRADE: 0 %
CHL CUP STRESS STAGE 2 HR: 78 {beats}/min
CHL CUP STRESS STAGE 3 HR: 77 {beats}/min
CHL CUP STRESS STAGE 4 DBP: 87 mmHg
CHL CUP STRESS STAGE 4 GRADE: 10 %
CHL CUP STRESS STAGE 4 HR: 102 {beats}/min
CHL CUP STRESS STAGE 4 SBP: 191 mmHg
CHL CUP STRESS STAGE 4 SPEED: 1.7 mph
CHL CUP STRESS STAGE 5 HR: 116 {beats}/min
CHL CUP STRESS STAGE 7 GRADE: 0 %
CHL CUP STRESS STAGE 7 HR: 116 {beats}/min
CHL CUP STRESS STAGE 7 SPEED: 0 mph
CHL CUP STRESS STAGE 8 DBP: 80 mmHg
CHL CUP STRESS STAGE 8 SBP: 131 mmHg
CSEPED: 7 min
CSEPEDS: 0 s
Estimated workload: 8.5 METS
MPHR: 166 {beats}/min
Peak HR: 137 {beats}/min
Percent HR: 83 %
Percent of predicted max HR: 82 %
RPE: 17
Stage 1 DBP: 85 mmHg
Stage 2 Speed: 1 mph
Stage 3 Grade: 0 %
Stage 3 Speed: 1 mph
Stage 5 Grade: 12 %
Stage 5 Speed: 2.5 mph
Stage 6 Grade: 14 %
Stage 6 HR: 137 {beats}/min
Stage 6 Speed: 3.4 mph
Stage 8 Grade: 0 %
Stage 8 HR: 83 {beats}/min
Stage 8 Speed: 0 mph

## 2015-02-01 ENCOUNTER — Ambulatory Visit: Payer: 59 | Admitting: Cardiology

## 2015-02-04 ENCOUNTER — Telehealth: Payer: Self-pay | Admitting: Cardiovascular Disease

## 2015-02-04 NOTE — Telephone Encounter (Signed)
C/o hypotension/ bradicardia yesterday and this morning, currently asymptomatic but has had times of slight dizziness. Pt and family have had cold/ viral symptoms of cough, congestion and fatigue x 1 week. Admits fluid intake has been decreased. Med's were verified. Reviewed with Boyce MediciBrittany Simmons PA Pt to continue to monitor bp, increase fluids and should add V-8 juice or Gatorade daily, if symptomatic she may take other strength of metoprolol 25 mg bid(has at home) but only if symptomatic. Pt to call back with further questions or concerns.  Pt verbalized understanding.

## 2015-02-04 NOTE — Telephone Encounter (Signed)
Pt c/o BP issue: STAT if pt c/o blurred vision, one-sided weakness or slurred speech  1. What are your last 5 BP readings? 86/60   HR    58  2. Are you having any other symptoms (ex. Dizziness, headache, blurred vision, passed out)? NO  3. What is your BP issue? Believe the medication is making her bp low   flecanide metrprolol elequis

## 2015-02-09 MED FILL — FLECAINIDE ACETATE 50 MG TA: 50 | 90 days supply | Qty: 180 | Fill #1

## 2015-02-09 MED FILL — METOPROLOL SUCC ER 50 MG TA: 50 | 90 days supply | Qty: 180 | Fill #1

## 2015-02-09 MED FILL — ELIQUIS 5 MG TABLET: 5 | 90 days supply | Qty: 180 | Fill #1

## 2015-02-11 ENCOUNTER — Telehealth: Payer: Self-pay | Admitting: Cardiovascular Disease

## 2015-02-11 NOTE — Telephone Encounter (Signed)
Calling stating that she has had weight gain of 5-8  Lbs over the past week.  States she is retaining fluid.  She has swelling in ankles and fingers (worse in (L) ankle).  Wt today is 234; on Mon 1/16 wt was 230 and last week was 226 lbs.  Denies SOB and CP.  BP 89/60 yesterday and last week was 104/70.  States she did eat some lunch meat a couple of times this week. Spoke w/Dr. Elease Hashimoto who states for her to watch diet more carefully.  Does not feel it is cardiac related but will see her next week on 1/25 instead of her seeing Lady Saucier.  She verbalizes understanding and will watch her diet and see Dr. Elease Hashimoto next Wed at 9:15.

## 2015-02-11 NOTE — Telephone Encounter (Signed)
New message  Pt c/o medication issue: 1. Name of Medication:   metoprolol succinate (TOPROL-XL) 50 MG 24 hr tablet Flecainide  4. What is your medication issue? Pt called states that she is retaining a lot of fluid. Request a call back to discuss if she should take a diuretic and how to move forward. Pt states that she has gained about 8 lbs// Please call back to discuss

## 2015-02-16 ENCOUNTER — Encounter: Payer: Self-pay | Admitting: Cardiovascular Disease

## 2015-02-16 ENCOUNTER — Ambulatory Visit (INDEPENDENT_AMBULATORY_CARE_PROVIDER_SITE_OTHER): Payer: 59 | Admitting: Cardiovascular Disease

## 2015-02-16 ENCOUNTER — Ambulatory Visit: Payer: 59 | Admitting: Cardiology

## 2015-02-16 VITALS — BP 120/86 | HR 80 | Ht 63.0 in | Wt 235.8 lb

## 2015-02-16 DIAGNOSIS — I4892 Unspecified atrial flutter: Secondary | ICD-10-CM

## 2015-02-16 DIAGNOSIS — G4733 Obstructive sleep apnea (adult) (pediatric): Secondary | ICD-10-CM

## 2015-02-16 DIAGNOSIS — I5032 Chronic diastolic (congestive) heart failure: Secondary | ICD-10-CM

## 2015-02-16 MED ORDER — POTASSIUM CHLORIDE CRYS ER 20 MEQ PO TBCR: 20.0000 meq | EXTENDED_RELEASE_TABLET | Freq: Every day | ORAL | Status: DC

## 2015-02-16 MED ORDER — ASPIRIN EC 325 MG PO TBEC: 325.0000 mg | DELAYED_RELEASE_TABLET | Freq: Every day | ORAL | Status: DC

## 2015-02-16 MED ORDER — FUROSEMIDE 40 MG PO TABS: 40.0000 mg | ORAL_TABLET | Freq: Every day | ORAL | Status: DC

## 2015-02-16 MED FILL — FUROSEMIDE 40 MG TABLET: 40 | 30 days supply | Qty: 30 | Fill #0

## 2015-02-16 MED FILL — POTASSIUM CL ER 20 MEQ TAB: 20 | 90 days supply | Qty: 90 | Fill #0

## 2015-02-16 NOTE — Progress Notes (Signed)
Cardiology Office Note   Date:  02/16/2015   ID:  Beth Bowman, DOB Apr 27, 1960, MRN [id number]  PCP:  PROVIDER NOT IN SYSTEM  Cardiologist:   Nahser, Deloris Ping, MD   Chief Complaint  Patient presents with  . Atrial Fibrillation   Problem List 1. Atrial flutter ( paroxysmal ) 2. Obstructive sleep apnea.    History of Present Illness: Beth Bowman is a 55 y.o. female who presents for chest pain .   Last fall, she started having some vague upper left sided chest pain.  occasionall she would have upper back pain. Also has left upper arm pain and tingling. She went to see [name] Francene Boyers, MD ) , he had initially recommended an echo and then eventually recommended a GXT   She did not have insurance so she did not have the tests.    She also has palpitations for the past year She knows that she does not sleep well.  Wakes up in the early AM - has trouble getting back to sleep. Some early morning headaches. Sleepy during the day.   This past Sunday night, she had some palpitations / heart pounding after taking a nap and went to the ER.   She admits that she has been anxious about all of these symptoms   She joined the gym in the fall.  Did not have any CP or shortness of breath with exercise.  When these symptoms started up several months ago, she stopped going to the gym.   She works for [employer].  July 29, 2014:  Beth Bowman was admitted with rapid atrial flutter several weeks ago .    The diagnosis was made when she received adenosine and showed the flutter waves.   was started on metoprolol .   Seems to be better.    Feb 16, 2015:  Beth Bowman  is seen back for weight gain . She has been found have atrial fibrillation. She converted on flecainide. Tolerating only fairly well  ( side effects of blurred vision)  Some nausea  Has had some fluid retention .  Swollen ankles and fingers  Not eating any extra salt .      Past Medical History  Diagnosis  Date  . Migraines   . OSA (obstructive sleep apnea) 06/22/2014    Mild with AHI 6.7/hr    Past Surgical History  Procedure Laterality Date  . Cesarean section      x4  . Tonsillectomy    . Cardiac catheterization N/A 12/27/2014    Procedure: Left Heart Cath and Coronary Angiography;  Surgeon: Corky Crafts, MD;  Location: [facility] INVASIVE CV LAB;  Service: Cardiovascular;  Laterality: N/A;     Current Outpatient Prescriptions  Medication Sig Dispense Refill  . acetaminophen (TYLENOL) 500 MG tablet Take 1,000 mg by mouth every 8 (eight) hours as needed for mild pain or headache.    . ALPRAZolam (XANAX XR) 0.5 MG 24 hr tablet Take 1 tablet (0.5 mg total) by mouth 2 (two) times daily as needed for anxiety. 20 tablet 0  . apixaban (ELIQUIS) 5 MG TABS tablet Take 1 tablet (5 mg total) by mouth 2 (two) times daily. 60 tablet 11  . Coenzyme Q10 (COQ10 PO) Take 1 capsule by mouth daily.    . flecainide (TAMBOCOR) 50 MG tablet Take 1 tablet (50 mg total) by mouth 2 (two) times daily. 60 tablet 11  . GARLIC PO Take 1 tablet by mouth daily.    [name]  MAGNESIUM PO Take 1 tablet by mouth daily.    . metoprolol succinate (TOPROL-XL) 50 MG 24 hr tablet Take 1 tablet (50 mg total) by mouth 2 (two) times daily. 60 tablet 6  . nitroGLYCERIN (NITROSTAT) 0.4 MG SL tablet Place 0.4 mg under the tongue every 5 (five) minutes as needed for chest pain (x 3 doses).    [name] tetrahydrozoline 0.05 % ophthalmic solution Place 1 drop into both eyes daily as needed (FOR DRY EYES).      No current facility-administered medications for this visit.   Facility-Administered Medications Ordered in Other Visits  Medication Dose Route Frequency Provider Last Rate Last Dose  . aminophylline injection 75 mg  75 mg Intravenous BID PRN Laurey Morale, MD        Allergies:   Review of patient's allergies indicates no known allergies.    Social History:  The patient  reports that she has never smoked. She has never used smokeless  tobacco. She reports that she drinks alcohol. She reports that she does not use illicit drugs.   Family History:  The patient's family history includes Arrhythmia in her father.    ROS:  Please see the history of present illness.    Review of Systems: Constitutional:  denies fever, chills, diaphoresis, appetite change and fatigue.  HEENT: denies photophobia, eye pain, redness, hearing loss, ear pain, congestion, sore throat, rhinorrhea, sneezing, neck pain, neck stiffness and tinnitus.  Respiratory: admits to  DOE,   Cardiovascular: admits to chest pain, palpitations and.  Denies any  leg swelling.  Gastrointestinal: denies nausea, vomiting, abdominal pain, diarrhea, constipation, blood in stool.  Genitourinary: denies dysuria, urgency, frequency, hematuria, flank pain and difficulty urinating.  Musculoskeletal: denies  myalgias, back pain, joint swelling, arthralgias and gait problem.   Skin: denies pallor, rash and wound.  Neurological: denies dizziness, seizures, syncope, weakness, light-headedness, numbness and headaches.   Hematological: denies adenopathy, easy bruising, personal or family bleeding history.  Psychiatric/ Behavioral: denies suicidal ideation, mood changes, confusion, nervousness, sleep disturbance and agitation.       All other systems are reviewed and negative.    PHYSICAL EXAM: VS:  BP 120/86 mmHg  Pulse 80  Ht  (1.6 m)  Wt 235 lb 12.8 oz (106.958 kg)  BMI 41.78 kg/m2 , BMI Body mass index is 41.78 kg/(m^2). GEN: Well nourished, well developed, in no acute distress HEENT: normal Neck: no JVD, carotid bruits, or masses Cardiac: RRR; no murmurs, rubs, or gallops,no edema  Respiratory:  clear to auscultation bilaterally, normal work of breathing GI: soft, nontender, nondistended, + BS MS: no deformity or atrophy Skin: warm and dry, no rash Neuro:  Strength and sensation are intact Psych: normal   EKG:  EKG is ordered today. The ekg ordered today  demonstrates NSR at 88 RAD, NS ST changes, no changes from previous ECG   Recent Labs: 07/08/2014: B Natriuretic Peptide 237.5* 01/07/2015: ALT 20; BUN 13; Creatinine, Ser 0.75; Hemoglobin 15.6*; Platelets 394; Potassium 3.8; Sodium 143; TSH 1.061    Lipid Panel    Component Value Date/Time   CHOL 162 07/09/2014 0337   TRIG 121 07/09/2014 0337   HDL 48 07/09/2014 0337   CHOLHDL 3.4 07/09/2014 0337   VLDL 24 07/09/2014 0337   LDLCALC 90 07/09/2014 0337      Wt Readings from Last 3 Encounters:  02/16/15 235 lb 12.8 oz (106.958 kg)  12/27/14 225 lb (102.059 kg)  12/24/14 226 lb (102.513 kg)  Other studies Reviewed: Additional studies/ records that were reviewed today include: . Review of the above records demonstrates:    ASSESSMENT AND PLAN:  1.  Atrial flutter:    CHADS2VASC is 1 ( CHF)  Was found to  have atrial flutter during a hospitalization .  Likely related to her OSA. Strongly encouraged her to lose weight,  Control her OSA.    Discussed the possibility of catheter ablation eventually if this does not work.   She has been on the Eliquis for a month after cardioversion .   CHADS2VASC is ~1 ( CHF , not adding in female at this point ) Probably ok to ED Eliquis and start ASA 325.   2. OSA:   Has mild OSA,    Has been recommended to get a bite guard to pull her jaw forward.   3. Obesity :  Advised her to lose weight.  4. Chronic diastolic CHF:   Will add Lasix 40 mg a day  And Kdur 20 meq a day.   Will repeat her echo for further evaluation of her LF function .   Current medicines are reviewed at length with the patient today.  The patient does not have concerns regarding medicines.  The following changes have been made:  no change  Labs/ tests ordered today include:  No orders of the defined types were placed in this encounter.     Disposition:   FU with me in  3 months .      Signed, Nahser, Deloris Ping, MD  02/16/2015 10:16 AM    [facility] [address] [place], [place], [place]  [address] Phone: [phone]; Fax: [phone]

## 2015-02-16 NOTE — Patient Instructions (Addendum)
Medication Instructions:  STOP Eliquis START Aspirin 325 mg once daily START Lasix (furosemide) 40 mg once daily - take in the AM START Kdur 20 meq once daily - take with Lasix    Labwork: None Ordered   Testing/Procedures: Your physician has requested that you have an echocardiogram. Echocardiography is a painless test that uses sound waves to create images of your heart. It provides your doctor with information about the size and shape of your heart and how well your heart's chambers and valves are working. This procedure takes approximately one hour. There are no restrictions for this procedure.   Follow-Up: Your physician recommends that you schedule a follow-up appointment in: 3 months with Dr. Elease Hashimoto.    If you need a refill on your cardiac medications before your next appointment, please call your pharmacy.   Thank you for choosing [facility]! Eligha Bridegroom, RN [phone]

## 2015-03-02 ENCOUNTER — Other Ambulatory Visit: Payer: Self-pay

## 2015-03-02 ENCOUNTER — Ambulatory Visit (HOSPITAL_COMMUNITY): Payer: 59 | Attending: Cardiovascular Disease

## 2015-03-02 DIAGNOSIS — I5032 Chronic diastolic (congestive) heart failure: Secondary | ICD-10-CM | POA: Diagnosis not present

## 2015-03-02 DIAGNOSIS — I509 Heart failure, unspecified: Secondary | ICD-10-CM | POA: Diagnosis present

## 2015-03-02 DIAGNOSIS — R002 Palpitations: Secondary | ICD-10-CM | POA: Diagnosis not present

## 2015-03-02 DIAGNOSIS — G4733 Obstructive sleep apnea (adult) (pediatric): Secondary | ICD-10-CM | POA: Insufficient documentation

## 2015-03-02 DIAGNOSIS — I059 Rheumatic mitral valve disease, unspecified: Secondary | ICD-10-CM | POA: Diagnosis not present

## 2015-03-02 DIAGNOSIS — I517 Cardiomegaly: Secondary | ICD-10-CM | POA: Diagnosis not present

## 2015-03-16 ENCOUNTER — Telehealth: Payer: 59 | Admitting: Nurse Practitioner

## 2015-03-16 DIAGNOSIS — J0100 Acute maxillary sinusitis, unspecified: Secondary | ICD-10-CM | POA: Diagnosis not present

## 2015-03-16 MED ORDER — AZITHROMYCIN 250 MG PO TABS: ORAL_TABLET | ORAL | Status: DC

## 2015-03-16 MED FILL — CEFUROXIME AXETIL 500 MG TA: 500 | 5 days supply | Qty: 10 | Fill #0

## 2015-03-16 NOTE — Progress Notes (Signed)

## 2015-05-09 MED FILL — FLECAINIDE ACETATE 50 MG TA: 50 | 90 days supply | Qty: 180 | Fill #2

## 2015-05-09 MED FILL — FUROSEMIDE 40 MG TABLET: 40 | 30 days supply | Qty: 30 | Fill #1

## 2015-05-09 MED FILL — METOPROLOL SUCC ER 50 MG TA: 50 | 90 days supply | Qty: 180 | Fill #2

## 2015-05-17 ENCOUNTER — Ambulatory Visit: Payer: 59 | Admitting: Cardiovascular Disease

## 2015-05-18 ENCOUNTER — Encounter: Payer: Self-pay | Admitting: Cardiovascular Disease

## 2015-08-05 ENCOUNTER — Other Ambulatory Visit: Payer: Self-pay | Admitting: Physician Assistant

## 2015-08-05 MED FILL — METOPROLOL SUCC ER 50 MG TA: 50 | 30 days supply | Qty: 60 | Fill #0

## 2015-08-05 MED FILL — FUROSEMIDE 40 MG TABLET: 40 | 90 days supply | Qty: 90 | Fill #2

## 2015-08-05 MED FILL — FLECAINIDE ACETATE 50 MG TA: 50 | 90 days supply | Qty: 180 | Fill #3

## 2015-09-23 ENCOUNTER — Encounter: Payer: Self-pay | Admitting: Cardiovascular Disease

## 2015-09-23 ENCOUNTER — Other Ambulatory Visit: Payer: Self-pay | Admitting: Cardiovascular Disease

## 2015-09-23 MED FILL — METOPROLOL SUCC ER 50 MG TA: 50 | 90 days supply | Qty: 180 | Fill #0

## 2015-10-10 ENCOUNTER — Encounter: Payer: Self-pay | Admitting: Cardiovascular Disease

## 2015-10-10 ENCOUNTER — Ambulatory Visit (INDEPENDENT_AMBULATORY_CARE_PROVIDER_SITE_OTHER): Payer: 59 | Admitting: Cardiovascular Disease

## 2015-10-10 VITALS — BP 108/76 | HR 62 | Ht 62.0 in | Wt 243.8 lb

## 2015-10-10 DIAGNOSIS — I480 Paroxysmal atrial fibrillation: Secondary | ICD-10-CM | POA: Diagnosis not present

## 2015-10-10 DIAGNOSIS — I5032 Chronic diastolic (congestive) heart failure: Secondary | ICD-10-CM

## 2015-10-10 LAB — BASIC METABOLIC PANEL
BUN: 18 mg/dL (ref 7–25)
CALCIUM: 9.9 mg/dL (ref 8.6–10.4)
CHLORIDE: 102 mmol/L (ref 98–110)
CO2: 28 mmol/L (ref 20–31)
CREATININE: 0.73 mg/dL (ref 0.50–1.05)
GLUCOSE: 114 mg/dL — AB (ref 65–99)
Potassium: 4 mmol/L (ref 3.5–5.3)
Sodium: 141 mmol/L (ref 135–146)

## 2015-10-10 NOTE — Patient Instructions (Signed)
Medication Instructions:  Your physician recommends that you continue on your current medications as directed. Please refer to the Current Medication list given to you today.   Labwork: TODAY - Basic metabolic panel   Testing/Procedures: None Ordered   Follow-Up: Your physician wants you to follow-up in: 6 months with Dr. Elease HashimotoNahser.  You will receive a reminder letter in the mail two months in advance. If you don't receive a letter, please call our office to schedule the follow-up appointment.   If you need a refill on your cardiac medications before your next appointment, please call your pharmacy.   Thank you for choosing [facility]! Eligha BridegroomMichelle Eduarda Scrivens, RN [phone]

## 2015-10-10 NOTE — Progress Notes (Signed)
Cardiology Office Note   Date:  10/10/2015   ID:  Beth Bowman, DOB 1960/08/02, MRN [id number]  PCP:  PROVIDER NOT IN SYSTEM  Cardiologist:   Kristeen MissPhilip Nahser, MD   Chief Complaint  Patient presents with  . Follow-up   Problem List 1. Atrial flutter ( paroxysmal ) 2. Obstructive sleep apnea.    Beth Bowman is a 55 y.o. female who presents for chest pain .   Last fall, she started having some vague upper left sided chest pain.  occasionall she would have upper back pain. Also has left upper arm pain and tingling. She went to see [name] Francene Boyers( Darrell  Kalihl, MD ) , he had initially recommended an echo and then eventually recommended a GXT   She did not have insurance so she did not have the tests.    She also has palpitations for the past year She knows that she does not sleep well.  Wakes up in the early AM - has trouble getting back to sleep. Some early morning headaches. Sleepy during the day.   This past Sunday night, she had some palpitations / heart pounding after taking a nap and went to the ER.   She admits that she has been anxious about all of these symptoms   She joined the gym in the fall.  Did not have any CP or shortness of breath with exercise.  When these symptoms started up several months ago, she stopped going to the gym.   She works for [employer].  July 29, 2014:  Beth LarssonJill was admitted with rapid atrial flutter several weeks ago .    The diagnosis was made when she received adenosine and showed the flutter waves.   was started on metoprolol .   Seems to be better.    Feb 16, 2015:  Beth LarssonJill  is seen back for weight gain . She has been found have atrial fibrillation. She converted on flecainide. Tolerating only fairly well  ( side effects of blurred vision)  Some nausea  Has had some fluid retention .  Swollen ankles and fingers  Not eating any extra salt .   Sept. 18, 2017:  Tolerating the  Flecainide fairly well.  Some blurry  vision Has not had any tachycardia. Rhythm has been stable  Has had some weight gain   Wt Readings from Last 3 Encounters:  10/10/15 243 lb 12.8 oz (110.6 kg)  02/16/15 235 lb 12.8 oz (107 kg)  12/27/14 225 lb (102.1 kg)      Past Medical History:  Diagnosis Date  . Migraines   . OSA (obstructive sleep apnea) 06/22/2014   Mild with AHI 6.7/hr    Past Surgical History:  Procedure Laterality Date  . CARDIAC CATHETERIZATION N/A 12/27/2014   Procedure: Left Heart Cath and Coronary Angiography;  Surgeon: Corky CraftsJayadeep S Varanasi, MD;  [facility];  Service: Cardiovascular;  Laterality: N/A;  . CESAREAN SECTION     x4  . TONSILLECTOMY       Current Outpatient Prescriptions  Medication Sig Dispense Refill  . acetaminophen (TYLENOL) 500 MG tablet Take 1,000 mg by mouth every 8 (eight) hours as needed for mild pain or headache.    [name]. aspirin EC 325 MG tablet Take 1 tablet (325 mg total) by mouth daily. 30 tablet 0  . Coenzyme Q10 (COQ10 PO) Take 1 capsule by mouth daily.    . flecainide (TAMBOCOR) 50 MG tablet Take 1 tablet (50 mg total) by  mouth 2 (two) times daily. 60 tablet 11  . furosemide (LASIX) 40 MG tablet Take 1 tablet (40 mg total) by mouth daily. 30 tablet 11  . GARLIC PO Take 1 tablet by mouth daily.    [name] MAGNESIUM PO Take 1 tablet by mouth daily.    . metoprolol succinate (TOPROL-XL) 50 MG 24 hr tablet TAKE 1 TABLET BY MOUTH 2 TIMES DAILY. 60 tablet 3  . nitroGLYCERIN (NITROSTAT) 0.4 MG SL tablet Place 0.4 mg under the tongue every 5 (five) minutes as needed for chest pain (x 3 doses).    . potassium chloride SA (K-DUR,KLOR-CON) 20 MEQ tablet Take 1 tablet (20 mEq total) by mouth daily. 90 tablet 3  . tetrahydrozoline 0.05 % ophthalmic solution Place 1 drop into both eyes daily as needed (FOR DRY EYES).     [name] ALPRAZolam (XANAX XR) 0.5 MG 24 hr tablet Take 1 tablet (0.5 mg total) by mouth 2 (two) times daily as needed for anxiety. (Patient not taking: Reported  on 10/10/2015) 20 tablet 0   No current facility-administered medications for this visit.    Facility-Administered Medications Ordered in Other Visits  Medication Dose Route Frequency Provider Last Rate Last Dose  . aminophylline injection 75 mg  75 mg Intravenous BID PRN Laurey Morale, MD        Allergies:   Review of patient's allergies indicates no known allergies.    Social History:  The patient  reports that she has never smoked. She has never used smokeless tobacco. She reports that she drinks alcohol. She reports that she does not use drugs.   Family History:  The patient's family history includes Arrhythmia in her father.    ROS:  Please see the history of present illness.    Review of Systems: Constitutional:  denies fever, chills, diaphoresis, appetite change and fatigue.  HEENT: denies photophobia, eye pain, redness, hearing loss, ear pain, congestion, sore throat, rhinorrhea, sneezing, neck pain, neck stiffness and tinnitus.  Respiratory: admits to  DOE,   Cardiovascular: admits to chest pain, palpitations and.  Denies any  leg swelling.  Gastrointestinal: denies nausea, vomiting, abdominal pain, diarrhea, constipation, blood in stool.  Genitourinary: denies dysuria, urgency, frequency, hematuria, flank pain and difficulty urinating.  Musculoskeletal: denies  myalgias, back pain, joint swelling, arthralgias and gait problem.   Skin: denies pallor, rash and wound.  Neurological: denies dizziness, seizures, syncope, weakness, light-headedness, numbness and headaches.   Hematological: denies adenopathy, easy bruising, personal or family bleeding history.  Psychiatric/ Behavioral: denies suicidal ideation, mood changes, confusion, nervousness, sleep disturbance and agitation.       All other systems are reviewed and negative.    PHYSICAL EXAM: VS:  BP 108/76 (BP Location: Left Arm, Patient Position: Sitting, Cuff Size: Large)   Pulse 62   Ht 5\' 2"  (1.575 m)   Wt  243 lb 12.8 oz (110.6 kg)   BMI 44.59 kg/m  , BMI Body mass index is 44.59 kg/m. GEN: Well nourished, well developed, in no acute distress  HEENT: normal  Neck: no JVD, carotid bruits, or masses Cardiac: RRR; no murmurs, rubs, or gallops,no edema  Respiratory:  clear to auscultation bilaterally, normal work of breathing GI: soft, nontender, nondistended, + BS MS: no deformity or atrophy  Skin: warm and dry, no rash Neuro:  Strength and sensation are intact Psych: normal   EKG:  EKG is ordered today. The ekg ordered today demonstrates NSR at 62.   TWI in the anterior leads. No  significant changes compared to previous EKG on 12/24/2014.   Recent Labs: 01/07/2015: ALT 20; BUN 13; Creatinine, Ser 0.75; Hemoglobin 15.6; Platelets 394; Potassium 3.8; Sodium 143; TSH 1.061    Lipid Panel    Component Value Date/Time   CHOL 162 07/09/2014 0337   TRIG 121 07/09/2014 0337   HDL 48 07/09/2014 0337   CHOLHDL 3.4 07/09/2014 0337   VLDL 24 07/09/2014 0337   LDLCALC 90 07/09/2014 0337      Wt Readings from Last 3 Encounters:  10/10/15 243 lb 12.8 oz (110.6 kg)  02/16/15 235 lb 12.8 oz (107 kg)  12/27/14 225 lb (102.1 kg)      Other studies Reviewed: Additional studies/ records that were reviewed today include: . Review of the above records demonstrates:    ASSESSMENT AND PLAN:  1.  Atrial flutter:    CHADS2VASC is 1 ( CHF)  Was found to  have atrial flutter during a hospitalization .  Likely related to her OSA. Strongly encouraged her to lose weight,  Control her OSA.      Continue ASA. Has not had any recurrence  .   2. OSA:   Has mild OSA,    Has been recommended to get a bite guard to pull her jaw forward.   3. Obesity :  Advised her to lose weight. Has gained some weight since starting the metoprolol  4. Chronic diastolic CHF:    On Lasix 40 mg a day  And Kdur 20 meq a day.    will check BMP today   Current medicines are reviewed at length with the patient today.   The patient does not have concerns regarding medicines.  The following changes have been made:  no change  Labs/ tests ordered today include:  No orders of the defined types were placed in this encounter.    Disposition:   FU with me in  6 months      Signed, Kristeen Miss, MD  10/10/2015 11:06 AM    [facility] [address] [place], [place], [place]  [address] Phone: [phone]; Fax: [phone]

## 2015-11-01 MED FILL — FUROSEMIDE 40 MG TABLET: 40 | 90 days supply | Qty: 90 | Fill #3

## 2015-11-01 MED FILL — FLECAINIDE ACETATE 50 MG TA: 50 | 60 days supply | Qty: 120 | Fill #4

## 2015-12-12 ENCOUNTER — Other Ambulatory Visit: Payer: Self-pay | Admitting: Cardiovascular Disease

## 2015-12-12 MED FILL — METOPROLOL SUCC ER 50 MG TA: 50 | 30 days supply | Qty: 60 | Fill #1

## 2016-01-06 MED FILL — FLECAINIDE ACETATE 50 MG TA: 50 | 90 days supply | Qty: 180 | Fill #0

## 2016-01-17 ENCOUNTER — Other Ambulatory Visit: Payer: Self-pay | Admitting: Cardiovascular Disease

## 2016-01-17 MED FILL — METOPROLOL SUCC ER 50 MG TA: 50 | 90 days supply | Qty: 180 | Fill #0

## 2016-01-19 MED FILL — FUROSEMIDE 40 MG TABLET: 40 | 90 days supply | Qty: 90 | Fill #4

## 2016-01-19 MED FILL — KLOR-CON M20 TABLET: 20 | 90 days supply | Qty: 90 | Fill #1

## 2016-04-04 MED FILL — FLECAINIDE ACETATE 50 MG TA: 50 | 90 days supply | Qty: 180 | Fill #1

## 2016-04-04 MED FILL — METOPROLOL SUCC ER 50 MG TA: 50 | 90 days supply | Qty: 180 | Fill #1

## 2016-06-20 DIAGNOSIS — H5203 Hypermetropia, bilateral: Secondary | ICD-10-CM | POA: Diagnosis not present

## 2016-06-20 DIAGNOSIS — H524 Presbyopia: Secondary | ICD-10-CM | POA: Diagnosis not present

## 2016-06-20 DIAGNOSIS — H52203 Unspecified astigmatism, bilateral: Secondary | ICD-10-CM | POA: Diagnosis not present

## 2016-07-10 MED FILL — FLECAINIDE ACETATE 50 MG TA: 50 | 90 days supply | Qty: 180 | Fill #2

## 2016-07-10 MED FILL — METOPROLOL SUCC ER 50 MG TA: 50 | 90 days supply | Qty: 180 | Fill #2

## 2016-10-02 ENCOUNTER — Encounter: Payer: Self-pay | Admitting: Cardiovascular Disease

## 2016-10-02 ENCOUNTER — Ambulatory Visit (INDEPENDENT_AMBULATORY_CARE_PROVIDER_SITE_OTHER): Payer: 59 | Admitting: Cardiovascular Disease

## 2016-10-02 ENCOUNTER — Telehealth: Payer: Self-pay | Admitting: Cardiovascular Disease

## 2016-10-02 VITALS — BP 126/90 | HR 127 | Ht 62.0 in | Wt 256.0 lb

## 2016-10-02 DIAGNOSIS — I480 Paroxysmal atrial fibrillation: Secondary | ICD-10-CM | POA: Diagnosis not present

## 2016-10-02 MED ORDER — ALBUTEROL SULFATE HFA 108 (90 BASE) MCG/ACT IN AERS: 2.0000 | INHALATION_SPRAY | Freq: Four times a day (QID) | RESPIRATORY_TRACT | 2 refills | Status: DC | PRN

## 2016-10-02 MED ORDER — FLECAINIDE ACETATE 100 MG PO TABS: 100.0000 mg | ORAL_TABLET | Freq: Two times a day (BID) | ORAL | 11 refills | Status: DC

## 2016-10-02 MED FILL — FLECAINIDE ACETATE 100 MG T: 100 | 30 days supply | Qty: 60 | Fill #0

## 2016-10-02 MED FILL — VENTOLIN HFA 90 MCG INHALER: 108 (90 BAS | 28 days supply | Qty: 18 | Fill #0

## 2016-10-02 NOTE — Telephone Encounter (Signed)
Spoke with patient who states she is having fast and irregular HR today; states she is home from work today with viral chest congestion.  States HR is currently approximately 130 bpm. Took flecainide 50 mg and toprol 50 mg at 0940. She reports BP is 127/114 mmHg per home monitor. I offered her an appointment today with Dr. Elease HashimotoNahser and advised her to take an additional 25 mg Toprol. She verbalized understanding and agreement with plan and thanked me for the call.

## 2016-10-02 NOTE — Patient Instructions (Addendum)
Medication Instructions:  INCREASE Flecainide to 100 mg twice daily START Albuterol inhaler 2 puffs every 6 hrs as needed for wheezing/coughing   Labwork: None Ordered   Testing/Procedures: None Ordered   Follow-Up: Your physician recommends that you return on Monday, October 22 at 8:40 am     If you need a refill on your cardiac medications before your next appointment, please call your pharmacy.   Thank you for choosing [facility]! Eligha BridegroomMichelle Ragena Fiola, RN [phone]

## 2016-10-02 NOTE — Progress Notes (Signed)
Cardiology Office Note   Date:  10/02/2016   ID:  Beth Bowman, DOB 09-29-1960, MRN [id number]  PCP:  System, Provider Not In  Cardiologist:   Kristeen Miss, MD   Chief Complaint  Patient presents with  . Atrial Fibrillation    work in/ rate of 130 this morning   Problem List 1. Atrial flutter ( paroxysmal ) - CHADS2VASC = 1 ( female)  2. Obstructive sleep apnea.    Beth Bowman is a 56 y.o. female who presents for chest pain .   Last fall, she started having some vague upper left sided chest pain.  occasionall she would have upper back pain. Also has left upper arm pain and tingling. She went to see [name] Francene Boyers, MD ) , he had initially recommended an echo and then eventually recommended a GXT   She did not have insurance so she did not have the tests.    She also has palpitations for the past year She knows that she does not sleep well.  Wakes up in the early AM - has trouble getting back to sleep. Some early morning headaches. Sleepy during the day.   This past Sunday night, she had some palpitations / heart pounding after taking a nap and went to the ER.   She admits that she has been anxious about all of these symptoms   She joined the gym in the fall.  Did not have any CP or shortness of breath with exercise.  When these symptoms started up several months ago, she stopped going to the gym.   She works for [employer].  July 29, 2014:  Beth Bowman was admitted with rapid atrial flutter several weeks ago .    The diagnosis was made when she received adenosine and showed the flutter waves.   was started on metoprolol .   Seems to be better.    Feb 16, 2015:  Beth Bowman  is seen back for weight gain . She has been found have atrial fibrillation. She converted on flecainide. Tolerating only fairly well  ( side effects of blurred vision)  Some nausea  Has had some fluid retention .  Swollen ankles and fingers  Not eating any extra salt .   Sept.  18, 2017:  Tolerating the  Flecainide fairly well.  Some blurry vision Has not had any tachycardia. Rhythm has been stable  Has had some weight gain   Sept. 11, 2018  Beth Bowman is seen for work in visit - is back in A-flutter.  Has a URI . Lots of coughing . Has been doing well Ran a fever last night ,  Wheezing off and on .  Has not missed any Flecainide doses.   Wt Readings from Last 3 Encounters:  10/02/16 256 lb (116.1 kg)  10/10/15 243 lb 12.8 oz (110.6 kg)  02/16/15 235 lb 12.8 oz (107 kg)      Past Medical History:  Diagnosis Date  . Migraines   . OSA (obstructive sleep apnea) 06/22/2014   Mild with AHI 6.7/hr    Past Surgical History:  Procedure Laterality Date  . CARDIAC CATHETERIZATION N/A 12/27/2014   Procedure: Left Heart Cath and Coronary Angiography;  Surgeon: Corky Crafts, MD;  Location: [facility] INVASIVE CV LAB;  Service: Cardiovascular;  Laterality: N/A;  . CESAREAN SECTION     x4  . TONSILLECTOMY       Current Outpatient Prescriptions  Medication Sig Dispense Refill  . acetaminophen (TYLENOL)  500 MG tablet Take 1,000 mg by mouth every 8 (eight) hours as needed for mild pain or headache.    [name]. aspirin EC 325 MG tablet Take 1 tablet (325 mg total) by mouth daily. 30 tablet 0  . Coenzyme Q10 (COQ10 PO) Take 1 capsule by mouth daily.    . diphenhydrAMINE (BENADRYL) 25 MG tablet Take 25 mg by mouth every 6 (six) hours as needed for allergies.    . flecainide (TAMBOCOR) 50 MG tablet TAKE 1 TABLET BY MOUTH 2 TIMES DAILY. 60 tablet 9  . furosemide (LASIX) 40 MG tablet Take 40 mg by mouth daily as needed (swelling).    . GARLIC PO Take 1 tablet by mouth daily.    . GuaiFENesin (MUCINEX PO) Take 1 tablet by mouth every 4 (four) hours.    [name]. ibuprofen (ADVIL,MOTRIN) 200 MG tablet Take 200 mg by mouth every 6 (six) hours as needed (pain or migraine).    [name]. MAGNESIUM PO Take 1 tablet by mouth daily.    . metoprolol succinate (TOPROL-XL) 50 MG 24 hr tablet TAKE 1  TABLET BY MOUTH 2 TIMES DAILY. 180 tablet 2  . nitroGLYCERIN (NITROSTAT) 0.4 MG SL tablet Place 0.4 mg under the tongue every 5 (five) minutes as needed for chest pain (x 3 doses).    . potassium chloride SA (K-DUR,KLOR-CON) 20 MEQ tablet Take 20 mEq by mouth daily as needed (when taking Lasix).    [name]. tetrahydrozoline 0.05 % ophthalmic solution Place 1 drop into both eyes daily as needed (FOR DRY EYES).      No current facility-administered medications for this visit.    Facility-Administered Medications Ordered in Other Visits  Medication Dose Route Frequency Provider Last Rate Last Dose  . aminophylline injection 75 mg  75 mg Intravenous BID PRN Laurey MoraleMcLean, Dalton S, MD        Allergies:   Patient has no known allergies.    Social History:  The patient  reports that she has never smoked. She has never used smokeless tobacco. She reports that she drinks alcohol. She reports that she does not use drugs.   Family History:  The patient's family history includes Arrhythmia in her father.    ROS:  Please see the history of present illness.    Review of Systems: Constitutional:  denies fever, chills, diaphoresis, appetite change and fatigue.  HEENT: denies photophobia, eye pain, redness, hearing loss, ear pain, congestion, sore throat, rhinorrhea, sneezing, neck pain, neck stiffness and tinnitus.  Respiratory: admits to  DOE,   Cardiovascular: admits to chest pain, palpitations and.  Denies any  leg swelling.  Gastrointestinal: denies nausea, vomiting, abdominal pain, diarrhea, constipation, blood in stool.  Genitourinary: denies dysuria, urgency, frequency, hematuria, flank pain and difficulty urinating.  Musculoskeletal: denies  myalgias, back pain, joint swelling, arthralgias and gait problem.   Skin: denies pallor, rash and wound.  Neurological: denies dizziness, seizures, syncope, weakness, light-headedness, numbness and headaches.   Hematological: denies adenopathy, easy bruising,  personal or family bleeding history.  Psychiatric/ Behavioral: denies suicidal ideation, mood changes, confusion, nervousness, sleep disturbance and agitation.       All other systems are reviewed and negative.    PHYSICAL EXAM: VS:  BP 126/90   Pulse (!) 127   Ht 5\' 2"  (1.575 m)   Wt 256 lb (116.1 kg)   BMI 46.82 kg/m  , BMI Body mass index is 46.82 kg/m. GEN: obese female, has a URI   HEENT: normal  Neck: no JVD,  carotid bruits, or masses Cardiac: RR , tachy ; no murmurs, rubs, or gallops,no edema  Respiratory:  Wheezes  GI: soft, nontender, nondistended, + BS MS: no deformity or atrophy  Skin: warm and dry, no rash Neuro:  Strength and sensation are intact Psych: normal   EKG:  EKG is ordered today. Atrial flutter wit 2:1 conduction    Recent Labs: 10/10/2015: BUN 18; Creat 0.73; Potassium 4.0; Sodium 141    Lipid Panel    Component Value Date/Time   CHOL 162 07/09/2014 0337   TRIG 121 07/09/2014 0337   HDL 48 07/09/2014 0337   CHOLHDL 3.4 07/09/2014 0337   VLDL 24 07/09/2014 0337   LDLCALC 90 07/09/2014 0337      Wt Readings from Last 3 Encounters:  10/02/16 256 lb (116.1 kg)  10/10/15 243 lb 12.8 oz (110.6 kg)  02/16/15 235 lb 12.8 oz (107 kg)      Other studies Reviewed: Additional studies/ records that were reviewed today include: . Review of the above records demonstrates:    ASSESSMENT AND PLAN:  1.  Atrial flutter:    CHADS2VASC is 1 ( CHF)   Will increase Flecainide to 100 BID ,  continue toprol I think this most recent exacerbation of her atrial flutter is due to her upper respiratory tract infection. I'll give her some albuterol HFA. She'll need to contact her primary medical doctor for further advice regarding her upper respiratory tract infection.   Continue ASA.  We discussed starting  Eliquis. . She does not want to start an anticoagulant.   2. OSA:   Has mild OSA,    Has been recommended to get a bite guard to pull her jaw  forward.   3. Obesity :  Advised her to lose weight. Has gained some weight since starting the metoprolol  4. Chronic diastolic CHF:    On Lasix 40 mg a day  And Kdur 20 meq a day.    will check BMP today   Current medicines are reviewed at length with the patient today.  The patient does not have concerns regarding medicines.  The following changes have been made:  no change  Labs/ tests ordered today include:  No orders of the defined types were placed in this encounter.    Disposition:   FU with me in  1 month      Signed, Kristeen Miss, MD  10/02/2016 4:33 PM    [facility] [address] [place], [place], [place]  [address] Phone: [phone]; Fax: [phone]

## 2016-10-02 NOTE — Telephone Encounter (Signed)
Pt has Bronchitis and feels this has pushed in into Afib-taking Flecinide and Toprol bid, last dose 930am doesn't seem to be making a difference-BP 139-107  Pt wants advice as to what to look for in case she needs to go to ER-vs when to or if she can take extra med  [phone]

## 2016-10-17 ENCOUNTER — Other Ambulatory Visit: Payer: Self-pay | Admitting: Cardiovascular Disease

## 2016-10-17 MED FILL — METOPROLOL SUCC ER 50 MG TA: 50 | 90 days supply | Qty: 180 | Fill #0

## 2016-10-29 MED FILL — FLECAINIDE ACETATE 100 MG T: 100 | 30 days supply | Qty: 60 | Fill #1

## 2016-11-12 ENCOUNTER — Ambulatory Visit (INDEPENDENT_AMBULATORY_CARE_PROVIDER_SITE_OTHER): Payer: 59 | Admitting: Cardiovascular Disease

## 2016-11-12 ENCOUNTER — Encounter: Payer: Self-pay | Admitting: Cardiovascular Disease

## 2016-11-12 VITALS — BP 124/72 | HR 61 | Ht 62.0 in | Wt 243.0 lb

## 2016-11-12 DIAGNOSIS — Z13220 Encounter for screening for lipoid disorders: Secondary | ICD-10-CM

## 2016-11-12 DIAGNOSIS — I482 Chronic atrial fibrillation, unspecified: Secondary | ICD-10-CM

## 2016-11-12 DIAGNOSIS — I480 Paroxysmal atrial fibrillation: Secondary | ICD-10-CM

## 2016-11-12 DIAGNOSIS — I5032 Chronic diastolic (congestive) heart failure: Secondary | ICD-10-CM

## 2016-11-12 LAB — BASIC METABOLIC PANEL
BUN / CREAT RATIO: 24 — AB (ref 9–23)
BUN: 17 mg/dL (ref 6–24)
CO2: 22 mmol/L (ref 20–29)
Calcium: 9.5 mg/dL (ref 8.7–10.2)
Chloride: 102 mmol/L (ref 96–106)
Creatinine, Ser: 0.72 mg/dL (ref 0.57–1.00)
GFR calc non Af Amer: 94 mL/min/{1.73_m2} (ref >59)
GFR, EST AFRICAN AMERICAN: 108 mL/min/{1.73_m2} (ref >59)
GLUCOSE: 103 mg/dL — AB (ref 65–99)
Potassium: 4.6 mmol/L (ref 3.5–5.2)
SODIUM: 140 mmol/L (ref 134–144)

## 2016-11-12 LAB — LIPID PANEL
CHOLESTEROL TOTAL: 155 mg/dL (ref 100–199)
Chol/HDL Ratio: 3.1 ratio (ref 0.0–4.4)
HDL: 50 mg/dL (ref >39)
LDL Calculated: 91 mg/dL (ref 0–99)
Triglycerides: 71 mg/dL (ref 0–149)
VLDL CHOLESTEROL CAL: 14 mg/dL (ref 5–40)

## 2016-11-12 LAB — HEPATIC FUNCTION PANEL
ALK PHOS: 64 IU/L (ref 39–117)
ALT: 16 IU/L (ref 0–32)
AST: 17 IU/L (ref 0–40)
Albumin: 4.4 g/dL (ref 3.5–5.5)
BILIRUBIN TOTAL: 0.6 mg/dL (ref 0.0–1.2)
BILIRUBIN, DIRECT: 0.17 mg/dL (ref 0.00–0.40)
TOTAL PROTEIN: 6.6 g/dL (ref 6.0–8.5)

## 2016-11-12 LAB — TSH: TSH: 2.46 u[IU]/mL (ref 0.450–4.500)

## 2016-11-12 NOTE — Progress Notes (Signed)
Cardiology Office Note   Date:  11/12/2016   ID:  Beth Bowman, DOB 1960/11/09, MRN [id number]  PCP:  System, Provider Not In  Cardiologist:   Kristeen Miss, MD   Chief Complaint  Patient presents with  . Atrial Fibrillation   Problem List 1. Atrial flutter ( paroxysmal ) - CHADS2VASC = 2 ( diastolic CHF ,  female)  2. Obstructive sleep apnea. 3.  Chronic diastolic CHF    Beth Bowman is a 56 y.o. female who presents for chest pain .   Last fall, she started having some vague upper left sided chest pain.  occasionall she would have upper back pain. Also has left upper arm pain and tingling. She went to see [name] Francene Boyers, MD ) , he had initially recommended an echo and then eventually recommended a GXT   She did not have insurance so she did not have the tests.    She also has palpitations for the past year She knows that she does not sleep well.  Wakes up in the early AM - has trouble getting back to sleep. Some early morning headaches. Sleepy during the day.   This past Sunday night, she had some palpitations / heart pounding after taking a nap and went to the ER.   She admits that she has been anxious about all of these symptoms   She joined the gym in the fall.  Did not have any CP or shortness of breath with exercise.  When these symptoms started up several months ago, she stopped going to the gym.   She works for [employer].  July 29, 2014:  Beth Bowman was admitted with rapid atrial flutter several weeks ago .    The diagnosis was made when she received adenosine and showed the flutter waves.   was started on metoprolol .   Seems to be better.    Feb 16, 2015:  Beth Bowman  is seen back for weight gain . She has been found have atrial fibrillation. She converted on flecainide. Tolerating only fairly well  ( side effects of blurred vision)  Some nausea  Has had some fluid retention .  Swollen ankles and fingers  Not eating any extra salt .    Sept. 18, 2017:  Tolerating the  Flecainide fairly well.  Some blurry vision Has not had any tachycardia. Rhythm has been stable  Has had some weight gain   Sept. 11, 2018  Beth Bowman is seen for work in visit - is back in A-flutter.  Has a URI . Lots of coughing . Has been doing well Ran a fever last night ,  Wheezing off and on .  Has not missed any Flecainide doses.   Oct. 22, 2018:  Doing better on the higher dose of Flecainide Has lost 15 lbs - has cut her carbs  Hr has been slow - in the 50s on occasion .  Has had another episode of AF since I last saw her.   Lasted 12 hours  Is not on Eliquis ( was have discussed CHADS2VASC )   Wt Readings from Last 3 Encounters:  11/12/16 243 lb (110.2 kg)  10/02/16 256 lb (116.1 kg)  10/10/15 243 lb 12.8 oz (110.6 kg)      Past Medical History:  Diagnosis Date  . Migraines   . OSA (obstructive sleep apnea) 06/22/2014   Mild with AHI 6.7/hr    Past Surgical History:  Procedure Laterality Date  . CARDIAC  CATHETERIZATION N/A 12/27/2014   Procedure: Left Heart Cath and Coronary Angiography;  Surgeon: Corky CraftsJayadeep S Varanasi, MD;  [facility] CV LAB;  Service: Cardiovascular;  Laterality: N/A;  . CESAREAN SECTION     x4  . TONSILLECTOMY       Current Outpatient Prescriptions  Medication Sig Dispense Refill  . acetaminophen (TYLENOL) 500 MG tablet Take 1,000 mg by mouth every 8 (eight) hours as needed for mild pain or headache.    . albuterol (PROVENTIL HFA;VENTOLIN HFA) 108 (90 Base) MCG/ACT inhaler Inhale 2 puffs into the lungs every 6 (six) hours as needed for wheezing or shortness of breath. 1 Inhaler 2  . aspirin EC 325 MG tablet Take 1 tablet (325 mg total) by mouth daily. 30 tablet 0  . Coenzyme Q10 (COQ10 PO) Take 1 capsule by mouth daily.    . diphenhydrAMINE (BENADRYL) 25 MG tablet Take 25 mg by mouth every 6 (six) hours as needed for allergies.    . flecainide (TAMBOCOR) 100 MG tablet Take 1 tablet (100 mg  total) by mouth 2 (two) times daily. 60 tablet 11  . furosemide (LASIX) 40 MG tablet Take 40 mg by mouth daily as needed (swelling).    . GARLIC PO Take 1 tablet by mouth daily.    . GuaiFENesin (MUCINEX PO) Take 1 tablet by mouth every 4 (four) hours.    [name]. ibuprofen (ADVIL,MOTRIN) 200 MG tablet Take 200 mg by mouth every 6 (six) hours as needed (pain or migraine).    [name]. MAGNESIUM PO Take 1 tablet by mouth daily.    . metoprolol succinate (TOPROL-XL) 50 MG 24 hr tablet Take 1 tablet (50 mg total) by mouth 2 (two) times daily. 180 tablet 3  . nitroGLYCERIN (NITROSTAT) 0.4 MG SL tablet Place 0.4 mg under the tongue every 5 (five) minutes as needed for chest pain (x 3 doses).    . potassium chloride SA (K-DUR,KLOR-CON) 20 MEQ tablet Take 20 mEq by mouth daily as needed (when taking Lasix).    [name]. tetrahydrozoline 0.05 % ophthalmic solution Place 1 drop into both eyes daily as needed (FOR DRY EYES).      No current facility-administered medications for this visit.    Facility-Administered Medications Ordered in Other Visits  Medication Dose Route Frequency Provider Last Rate Last Dose  . aminophylline injection 75 mg  75 mg Intravenous BID PRN Laurey MoraleMcLean, Dalton S, MD        Allergies:   Patient has no known allergies.    Social History:  The patient  reports that she has never smoked. She has never used smokeless tobacco. She reports that she drinks alcohol. She reports that she does not use drugs.   Family History:  The patient's family history includes Arrhythmia in her father.    ROS:  Please see the history of present illness.    Review of Systems: Constitutional:  denies fever, chills, diaphoresis, appetite change and fatigue.  HEENT: denies photophobia, eye pain, redness, hearing loss, ear pain, congestion, sore throat, rhinorrhea, sneezing, neck pain, neck stiffness and tinnitus.  Respiratory: admits to  DOE,   Cardiovascular: admits to chest pain, palpitations and.  Denies any  leg  swelling.  Gastrointestinal: denies nausea, vomiting, abdominal pain, diarrhea, constipation, blood in stool.  Genitourinary: denies dysuria, urgency, frequency, hematuria, flank pain and difficulty urinating.  Musculoskeletal: denies  myalgias, back pain, joint swelling, arthralgias and gait problem.   Skin: denies pallor, rash and wound.  Neurological: denies dizziness, seizures, syncope,  weakness, light-headedness, numbness and headaches.   Hematological: denies adenopathy, easy bruising, personal or family bleeding history.  Psychiatric/ Behavioral: denies suicidal ideation, mood changes, confusion, nervousness, sleep disturbance and agitation.       All other systems are reviewed and negative.    PHYSICAL EXAM: VS:  BP 124/72   Pulse 61   Ht 5\' 2"  (1.575 m)   Wt 243 lb (110.2 kg)   BMI 44.45 kg/m  , BMI Body mass index is 44.45 kg/m. GEN: obese female, has a URI   HEENT: normal  Neck: no JVD, carotid bruits, or masses Cardiac: RR , tachy ; no murmurs, rubs, or gallops,no edema  Respiratory:  Wheezes  GI: soft, nontender, nondistended, + BS MS: no deformity or atrophy  Skin: warm and dry, no rash Neuro:  Strength and sensation are intact Psych: normal   EKG:  EKG is ordered today. Oct. 22, 2018:  NSR at 61.   Poor R wave progression    Recent Labs: No results found for requested labs within last 8760 hours.    Lipid Panel    Component Value Date/Time   CHOL 162 07/09/2014 0337   TRIG 121 07/09/2014 0337   HDL 48 07/09/2014 0337   CHOLHDL 3.4 07/09/2014 0337   VLDL 24 07/09/2014 0337   LDLCALC 90 07/09/2014 0337      Wt Readings from Last 3 Encounters:  11/12/16 243 lb (110.2 kg)  10/02/16 256 lb (116.1 kg)  10/10/15 243 lb 12.8 oz (110.6 kg)      Other studies Reviewed: Additional studies/ records that were reviewed today include: . Review of the above records demonstrates:    ASSESSMENT AND PLAN:  1.  Atrial flutter:    CHADS2VASC is 2  (Diastolic CHF)    we discussed Eliquis . She wants to think about it  She is back in normal sinus rhythm. She feels much better.    2. OSA:   She's lost about 15 pounds. She is looking 40 losing more weight which may help control her obstructive sleep apnea   3. Obesity :   Is losing weight   4. Chronic diastolic CHF:   Doing well.    Current medicines are reviewed at length with the patient today.  The patient does not have concerns regarding medicines.  The following changes have been made:  no change  Labs/ tests ordered today include:  No orders of the defined types were placed in this encounter.    Disposition:   FU with me in  6 month      Signed, Kristeen Miss, MD  11/12/2016 9:24 AM    [facility] [address] [place], [place], [place]  [address] Phone: [phone]; Fax: [phone]

## 2016-11-12 NOTE — Patient Instructions (Signed)
Medication Instructions:  Your physician recommends that you continue on your current medications as directed. Please refer to the Current Medication list given to you today.   Labwork: TODAY - TSH, Lipids, Liver panel, Basic metabolic panel   Testing/Procedures: None Ordered   Follow-Up: Your physician wants you to follow-up in: 6 months with Dr. Elease HashimotoNahser.  You will receive a reminder letter in the mail two months in advance. If you don't receive a letter, please call our office to schedule the follow-up appointment.    If you need a refill on your cardiac medications before your next appointment, please call your pharmacy.   Thank you for choosing [facility]! Eligha BridegroomMichelle Kristalynn Coddington, RN [phone]

## 2016-11-26 ENCOUNTER — Other Ambulatory Visit: Payer: Self-pay | Admitting: Cardiovascular Disease

## 2016-11-26 MED FILL — FLECAINIDE ACETATE 100 MG T: 100 | 30 days supply | Qty: 60 | Fill #2

## 2016-11-27 MED FILL — FUROSEMIDE 40 MG TAB: 40 | 30 days supply | Qty: 30 | Fill #0

## 2016-12-28 MED FILL — FLECAINIDE ACETATE 100 MG T: 100 | 30 days supply | Qty: 60 | Fill #3

## 2017-01-10 MED FILL — METOPROLOL SUCC ER 50 MG TA: 50 | 90 days supply | Qty: 180 | Fill #1

## 2017-01-24 MED FILL — FLECAINIDE ACETATE 100 MG T: 100 | 30 days supply | Qty: 60 | Fill #4

## 2017-01-28 IMAGING — CR DG CHEST 2V
2 series · 2 of 2 positions shown · non-contrast
Comparison: None.

CLINICAL DATA: Intermittent fluctuating chest pain for 1 week.
Initial encounter.

EXAM:
CHEST  2 VIEW

[w chest pa]
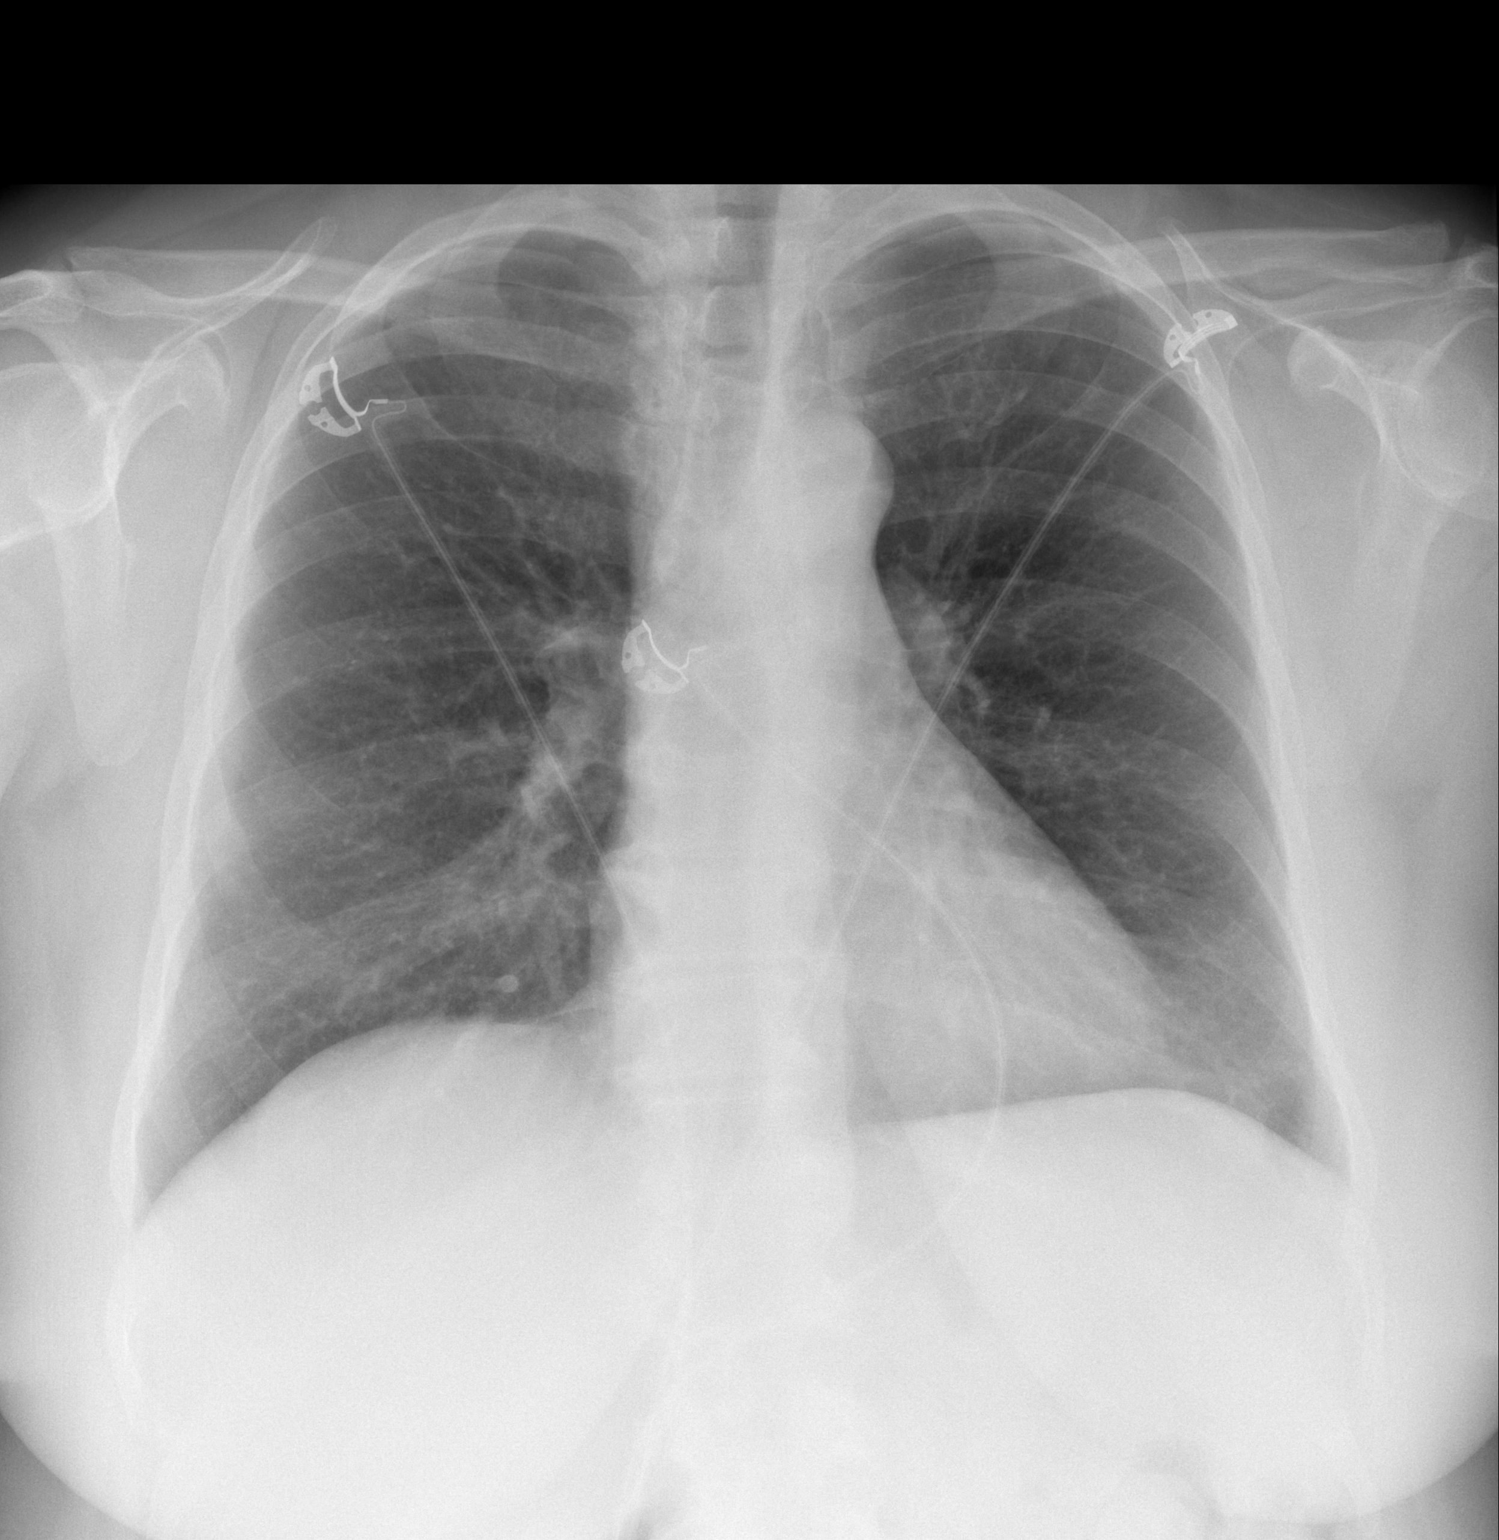

[w chest lat]
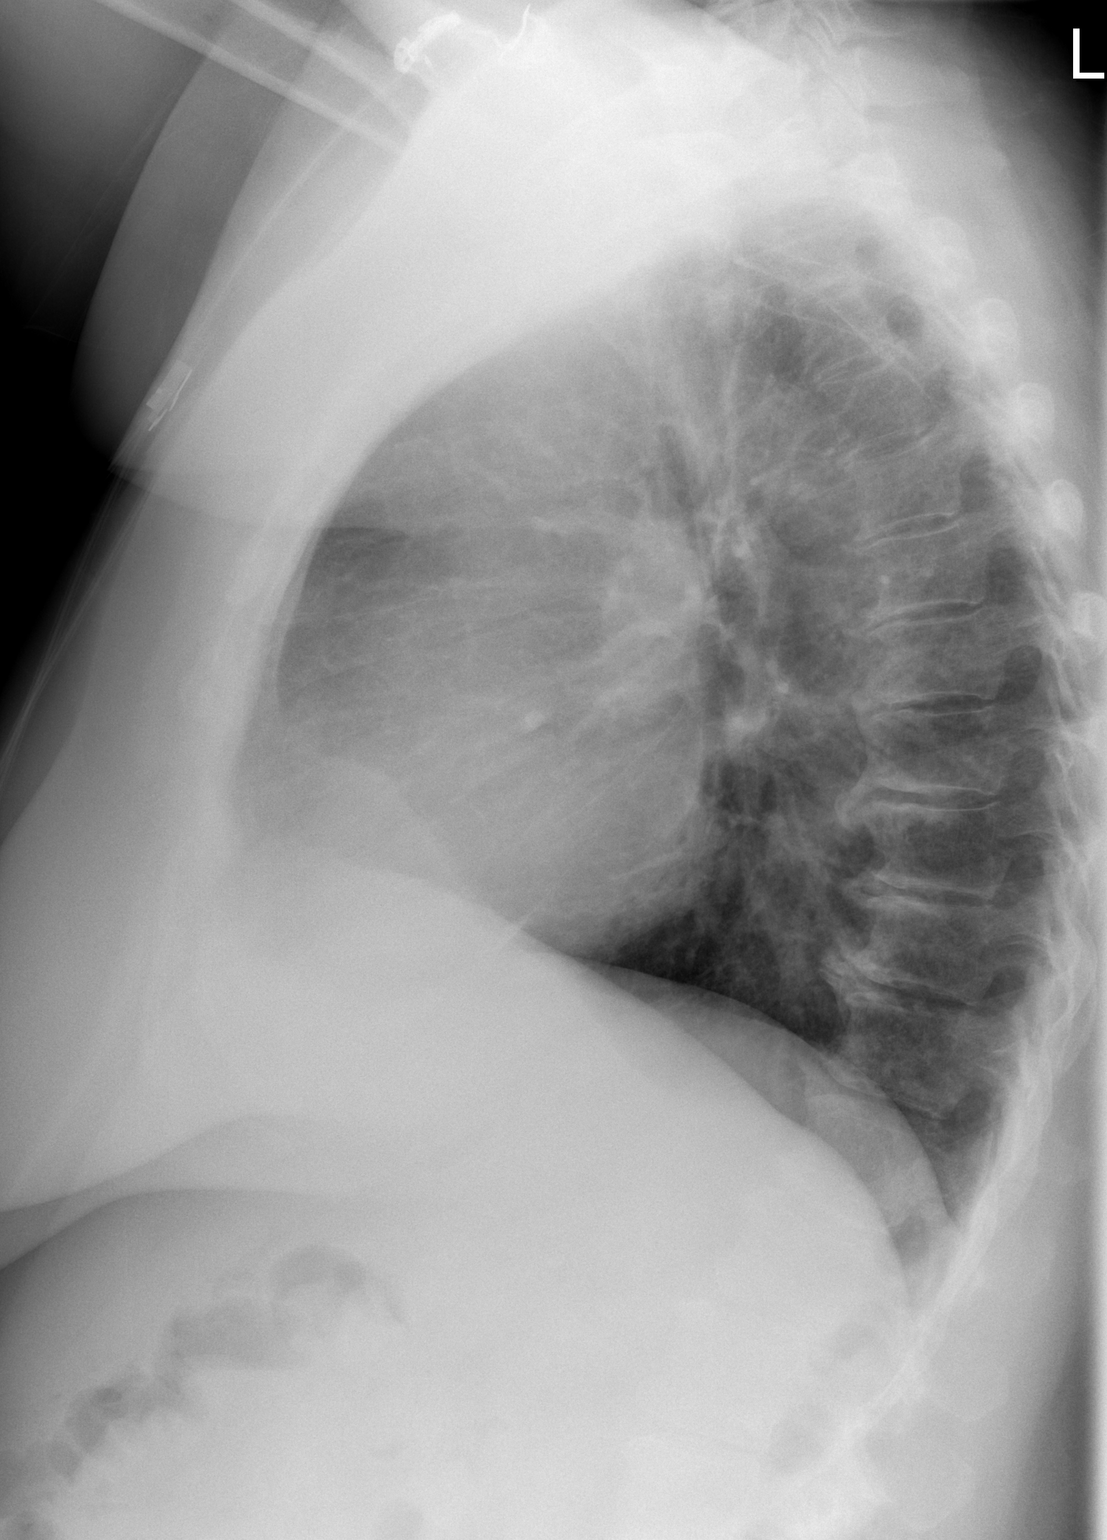

[2 of 2 positions shown; findings below may reference images not displayed]

FINDINGS: The lungs are well-aerated and clear. There is no evidence of focal
opacification, pleural effusion or pneumothorax.

The heart is normal in size; the mediastinal contour is within
normal limits. No acute osseous abnormalities are seen.
IMPRESSION: No acute cardiopulmonary process seen. No displaced rib fractures
identified.

## 2017-03-01 MED FILL — FLECAINIDE ACETATE 100 MG T: 100 | 30 days supply | Qty: 60 | Fill #5

## 2017-04-01 MED FILL — FLECAINIDE ACETATE 100 MG T: 100 | 90 days supply | Qty: 180 | Fill #6

## 2017-04-01 MED FILL — METOPROLOL SUCCINATE ER 50: 50 | 90 days supply | Qty: 180 | Fill #2

## 2017-06-02 ENCOUNTER — Telehealth: Payer: 59 | Admitting: Family

## 2017-06-02 DIAGNOSIS — N3000 Acute cystitis without hematuria: Secondary | ICD-10-CM

## 2017-06-02 MED ORDER — CEPHALEXIN 500 MG PO CAPS: 500.0000 mg | ORAL_CAPSULE | Freq: Two times a day (BID) | ORAL | 0 refills | Status: DC

## 2017-06-02 NOTE — Progress Notes (Signed)

## 2017-07-02 MED FILL — METOPROLOL SUCCINATE ER 50: 50 | 90 days supply | Qty: 180 | Fill #3

## 2017-07-02 MED FILL — FUROSEMIDE 40 MG TAB: 40 | 90 days supply | Qty: 90 | Fill #1

## 2017-07-02 MED FILL — FLECAINIDE ACETATE 100 MG T: 100 | 90 days supply | Qty: 180 | Fill #7

## 2017-10-04 ENCOUNTER — Other Ambulatory Visit: Payer: Self-pay | Admitting: Cardiovascular Disease

## 2017-10-04 MED FILL — METOPROLOL SUCCINATE ER 50: 50 | 60 days supply | Qty: 120 | Fill #0

## 2017-10-04 MED FILL — FLECAINIDE ACETATE 100 MG T: 100 | 60 days supply | Qty: 120 | Fill #0

## 2017-10-04 MED FILL — FUROSEMIDE 40 MG TAB: 40 | 90 days supply | Qty: 90 | Fill #2

## 2017-10-25 IMAGING — CR DG CHEST 1V PORT
1 series · 1 of 1 positions shown · non-contrast
Comparison: 07/08/2014 and earlier.

CLINICAL DATA: 54-year-old female with acute palpitations. Atrial
flutter. Initial encounter.

EXAM:
PORTABLE CHEST 1 VIEW

[AP]
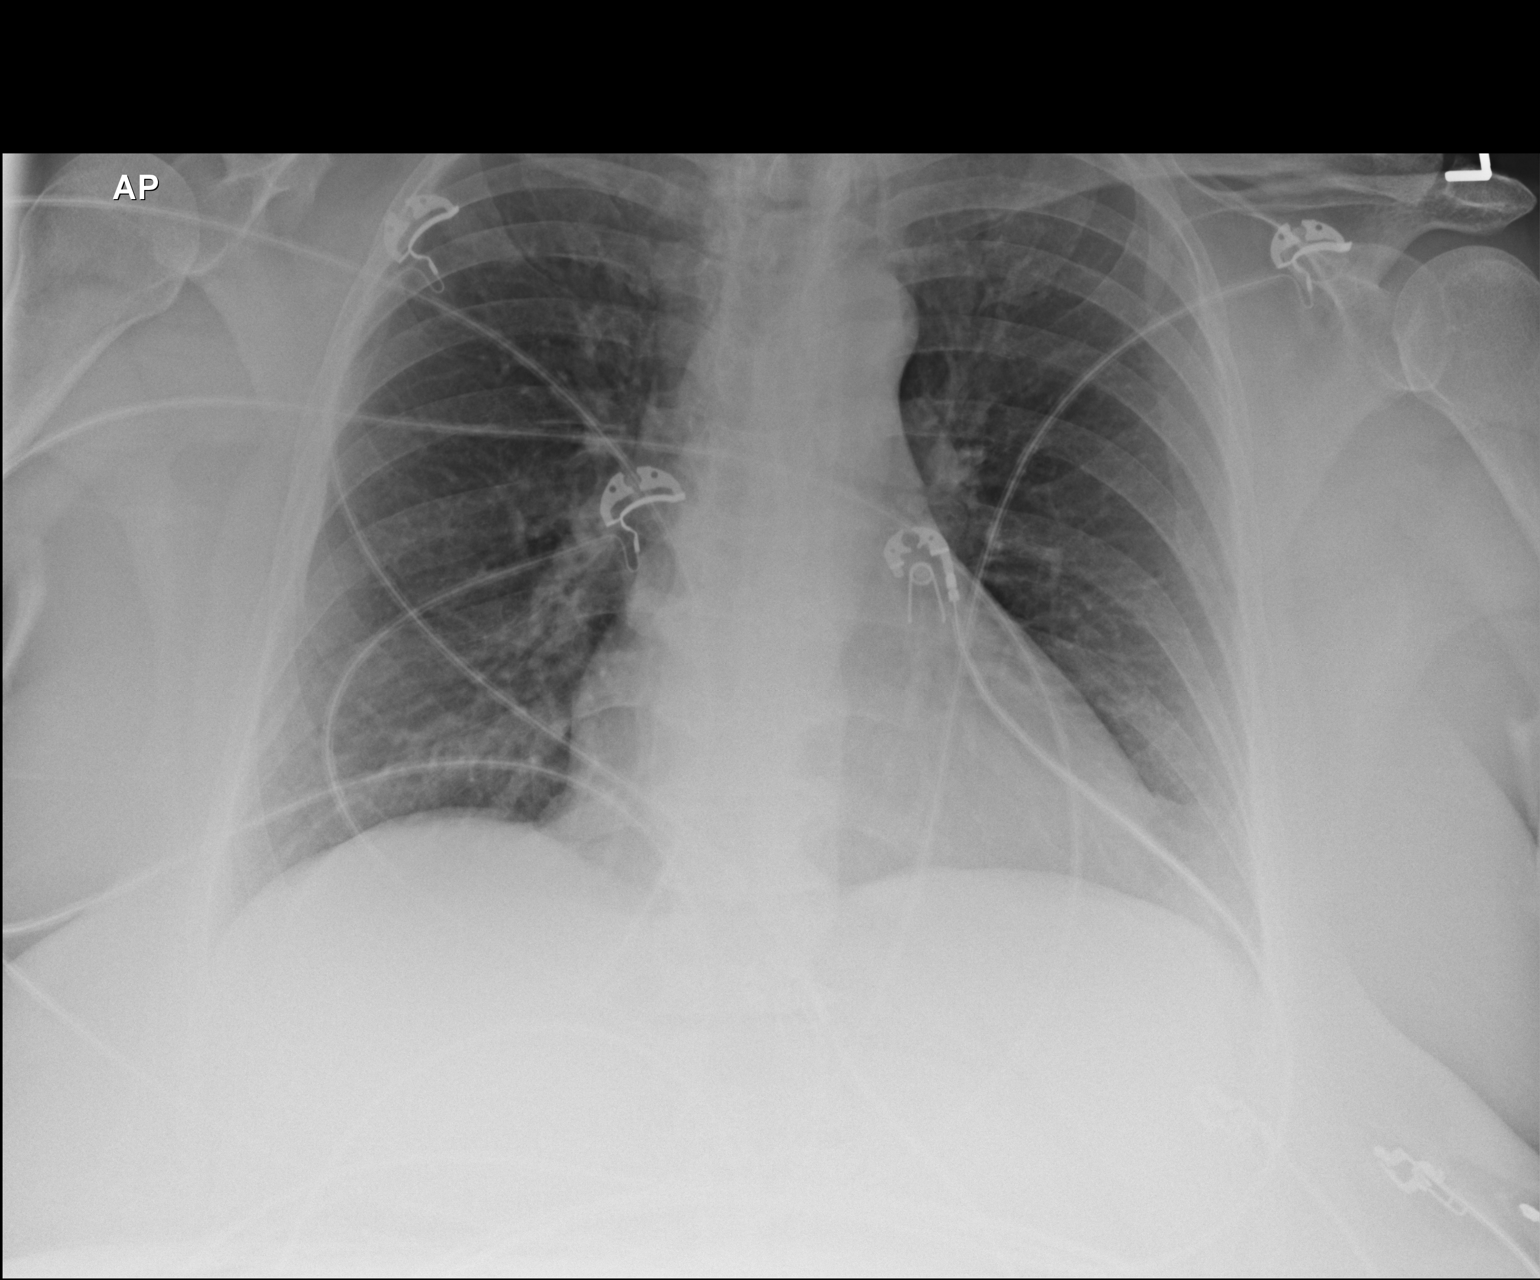

[1 of 1 positions shown; findings below may reference images not displayed]

FINDINGS: Portable AP semi upright view at 3137 hours. Normal cardiac size and
mediastinal contours. Stable lung volumes. Large body habitus.
Allowing for portable technique, the lungs are clear. Visualized
tracheal air column is within normal limits. No pneumothorax.
IMPRESSION: Negative, no acute cardiopulmonary abnormality.

## 2017-10-31 ENCOUNTER — Encounter: Payer: Self-pay | Admitting: *Deleted

## 2017-11-14 ENCOUNTER — Encounter: Payer: Self-pay | Admitting: Cardiovascular Disease

## 2017-11-14 ENCOUNTER — Ambulatory Visit (INDEPENDENT_AMBULATORY_CARE_PROVIDER_SITE_OTHER): Payer: Self-pay | Admitting: Cardiovascular Disease

## 2017-11-14 VITALS — BP 112/78 | HR 62 | Ht >= 80 in | Wt 197.0 lb

## 2017-11-14 DIAGNOSIS — I480 Paroxysmal atrial fibrillation: Secondary | ICD-10-CM

## 2017-11-14 LAB — BASIC METABOLIC PANEL
BUN / CREAT RATIO: 37 — AB (ref 9–23)
BUN: 28 mg/dL — AB (ref 6–24)
CO2: 23 mmol/L (ref 20–29)
CREATININE: 0.76 mg/dL (ref 0.57–1.00)
Calcium: 10 mg/dL (ref 8.7–10.2)
Chloride: 101 mmol/L (ref 96–106)
GFR calc Af Amer: 101 mL/min/{1.73_m2} (ref >59)
GFR, EST NON AFRICAN AMERICAN: 87 mL/min/{1.73_m2} (ref >59)
GLUCOSE: 84 mg/dL (ref 65–99)
Potassium: 4.2 mmol/L (ref 3.5–5.2)
SODIUM: 139 mmol/L (ref 134–144)

## 2017-11-14 MED ORDER — FLECAINIDE ACETATE 50 MG PO TABS: 50.0000 mg | ORAL_TABLET | Freq: Two times a day (BID) | ORAL | 3 refills | Status: DC

## 2017-11-14 MED ORDER — METOPROLOL SUCCINATE ER 50 MG PO TB24: 50.0000 mg | ORAL_TABLET | Freq: Two times a day (BID) | ORAL | 3 refills | Status: DC

## 2017-11-14 NOTE — Patient Instructions (Signed)
Medication Instructions:  Your physician has recommended you make the following change in your medication:   STOP Aspirin DECREASE Flecainide to 50 mg twice daily  Lab work: TODAY - basic metabolic panel  If you have labs (blood work) drawn today and your tests are completely normal, you will receive your results only by: [name] MyChart Message (if you have MyChart) OR . A paper copy in the mail If you have any lab test that is abnormal or we need to change your treatment, we will call you to review the results.   Testing/Procedures: None Ordered   Follow-Up: At [facility], you and your health needs are our priority.  As part of our continuing mission to provide you with exceptional heart care, we have created designated Provider Care Teams.  These Care Teams include your primary Cardiologist (physician) and Advanced Practice Providers (APPs -  Physician Assistants and Nurse Practitioners) who all work together to provide you with the care you need, when you need it. You will need a follow up appointment in:  1 year.  Please call our office 2 months in advance to schedule this appointment.  You may see Dr. Elease Hashimoto or one of the following Advanced Practice Providers on your designated Care Team: Tereso Newcomer, PA-C [place], [place] . Berton Bon, NP

## 2017-11-14 NOTE — Progress Notes (Signed)
Cardiology Office Note   Date:  11/14/2017   ID:  Beth Bowman, DOB 27-Sep-1960, MRN [id number]  PCP:  Dianne Dun, MD  Cardiologist:   Kristeen Miss, MD   Chief Complaint  Patient presents with  . Atrial Fibrillation   Problem List 1. Atrial flutter ( paroxysmal ) - CHADS2VASC = 2 ( diastolic CHF ,  female)  2. Obstructive sleep apnea. 3.  Chronic diastolic CHF    Beth Bowman is a 57 y.o. female who presents for chest pain .   Last fall, she started having some vague upper left sided chest pain.  occasionall she would have upper back pain. Also has left upper arm pain and tingling. She went to see [name] Francene Boyers, MD ) , he had initially recommended an echo and then eventually recommended a GXT   She did not have insurance so she did not have the tests.    She also has palpitations for the past year She knows that she does not sleep well.  Wakes up in the early AM - has trouble getting back to sleep. Some early morning headaches. Sleepy during the day.   This past Sunday night, she had some palpitations / heart pounding after taking a nap and went to the ER.   She admits that she has been anxious about all of these symptoms   She joined the gym in the fall.  Did not have any CP or shortness of breath with exercise.  When these symptoms started up several months ago, she stopped going to the gym.   She works for [employer].  July 29, 2014:  Beth Bowman was admitted with rapid atrial flutter several weeks ago .    The diagnosis was made when she received adenosine and showed the flutter waves.   was started on metoprolol .   Seems to be better.    Feb 16, 2015:  Beth Bowman  is seen back for weight gain . She has been found have atrial fibrillation. She converted on flecainide. Tolerating only fairly well  ( side effects of blurred vision)  Some nausea  Has had some fluid retention .  Swollen ankles and fingers  Not eating any extra salt .   Sept.  18, 2017:  Tolerating the  Flecainide fairly well.  Some blurry vision Has not had any tachycardia. Rhythm has been stable  Has had some weight gain   Sept. 11, 2018  Beth Bowman is seen for work in visit - is back in A-flutter.  Has a URI . Lots of coughing . Has been doing well Ran a fever last night ,  Wheezing off and on .  Has not missed any Flecainide doses.   Oct. 22, 2018:  Doing better on the higher dose of Flecainide Has lost 15 lbs - has cut her carbs  Hr has been slow - in the 50s on occasion .  Has had another episode of AF since I last saw her.   Lasted 12 hours  Is not on Eliquis ( was have discussed CHADS2VASC )   Nov 14, 2017:   Beth Bowman is diong well. Has lost 46 lbs since I last saw her ( 243 lbs to 197 lbs today )  Has cut carbs .   Has not had any Atrial fib .   Has borderline OSA .  Takes the furosemide and Kdur once a week ( or every other week )  - takes it if she  has some fluid retention .  Still eats some salty foods.    Wt Readings from Last 3 Encounters:  11/12/16 243 lb (110.2 kg)  10/02/16 256 lb (116.1 kg)  10/10/15 243 lb 12.8 oz (110.6 kg)      Past Medical History:  Diagnosis Date  . Migraines   . OSA (obstructive sleep apnea) 06/22/2014   Mild with AHI 6.7/hr    Past Surgical History:  Procedure Laterality Date  . CARDIAC CATHETERIZATION N/A 12/27/2014   Procedure: Left Heart Cath and Coronary Angiography;  Surgeon: Corky Crafts, MD;  Location: [facility] INVASIVE CV LAB;  Service: Cardiovascular;  Laterality: N/A;  . CESAREAN SECTION     x4  . TONSILLECTOMY       Current Outpatient Medications  Medication Sig Dispense Refill  . acetaminophen (TYLENOL) 500 MG tablet Take 1,000 mg by mouth every 8 (eight) hours as needed for mild pain or headache.    [name] aspirin EC 325 MG tablet Take 1 tablet (325 mg total) by mouth daily. 30 tablet 0  . Coenzyme Q10 (COQ10 PO) Take 1 capsule by mouth daily.    . diphenhydrAMINE (BENADRYL) 25 MG  tablet Take 25 mg by mouth every 6 (six) hours as needed for allergies.    . flecainide (TAMBOCOR) 100 MG tablet Take 1 tablet (100 mg total) by mouth 2 (two) times daily. 120 tablet 0  . furosemide (LASIX) 40 MG tablet Take 40 mg by mouth once a week.    [name] GARLIC PO Take 1 tablet by mouth daily.    [name] ibuprofen (ADVIL,MOTRIN) 200 MG tablet Take 200 mg by mouth every 6 (six) hours as needed (pain or migraine).    [name] MAGNESIUM PO Take 1 tablet by mouth daily.    . metoprolol succinate (TOPROL-XL) 50 MG 24 hr tablet TAKE 1 TABLET BY MOUTH 2 TIMES DAILY. 120 tablet 0  . potassium chloride SA (K-DUR,KLOR-CON) 20 MEQ tablet Take 20 mEq by mouth daily as needed (when taking Lasix).    [name] tetrahydrozoline 0.05 % ophthalmic solution Place 1 drop into both eyes daily as needed (FOR DRY EYES).      No current facility-administered medications for this visit.    Facility-Administered Medications Ordered in Other Visits  Medication Dose Route Frequency Provider Last Rate Last Dose  . aminophylline injection 75 mg  75 mg Intravenous BID PRN Laurey Morale, MD        Allergies:   Patient has no known allergies.    Social History:  The patient  reports that she has never smoked. She has never used smokeless tobacco. She reports that she drinks alcohol. She reports that she does not use drugs.   Family History:  The patient's family history includes Arrhythmia in her father.    ROS:  Please see the history of present illness.    EKG:  Oct. 24, 2019:   NSR at 62.   NS ST abnl.    Recent Labs: No results found for requested labs within last 8760 hours.    Lipid Panel    Component Value Date/Time   CHOL 155 11/12/2016 0946   TRIG 71 11/12/2016 0946   HDL 50 11/12/2016 0946   CHOLHDL 3.1 11/12/2016 0946   CHOLHDL 3.4 07/09/2014 0337   VLDL 24 07/09/2014 0337   LDLCALC 91 11/12/2016 0946      Wt Readings from Last 3 Encounters:  11/12/16 243 lb (110.2 kg)  10/02/16 256 lb (116.1 kg)  10/10/15 243 lb 12.8 oz (110.6 kg)      Other studies Reviewed: Additional studies/ records that were reviewed today include: . Review of the above records demonstrates:    ASSESSMENT AND PLAN:  1.  Atrial flutter:    CHADS2VASC is 2 (Diastolic CHF)    has not had any further episodes of Afib  Will DC ASA Decrease Flecainide to 50 BID  contiue metoprolol 50 BID for now Anticipate being able to reduce that dose as she loses weight    2. OSA:    -   - better with weight loss   3. Obesity :   Is losing weight  4. Chronic diastolic CHF:   Doing well.    Current medicines are reviewed at length with the patient today.  The patient does not have concerns regarding medicines.    Labs/ tests ordered today include:  No orders of the defined types were placed in this encounter.    Disposition:   FU with me in  1 year     Signed, Kristeen Miss, MD  11/14/2017 8:03 AM    [facility] [address] [place], [place], [place]  [address] Phone: [phone]; Fax: [phone]

## 2017-12-05 MED FILL — FLECAINIDE ACETATE 50 MG TA: 50 | 90 days supply | Qty: 180 | Fill #0

## 2017-12-05 MED FILL — METOPROLOL SUCCINATE ER 50: 50 | 90 days supply | Qty: 180 | Fill #0

## 2018-03-05 ENCOUNTER — Other Ambulatory Visit: Payer: Self-pay | Admitting: Cardiovascular Disease

## 2018-04-07 ENCOUNTER — Telehealth: Payer: Self-pay | Admitting: Physician Assistant

## 2018-04-07 ENCOUNTER — Encounter: Payer: Self-pay | Admitting: Physician Assistant

## 2018-04-07 DIAGNOSIS — J029 Acute pharyngitis, unspecified: Secondary | ICD-10-CM

## 2018-04-07 DIAGNOSIS — R05 Cough: Secondary | ICD-10-CM

## 2018-04-07 DIAGNOSIS — R059 Cough, unspecified: Secondary | ICD-10-CM

## 2018-04-07 NOTE — Progress Notes (Signed)
E-Visit for [facility] Virus Screening Based on your current symptoms, it seems unlikely that your symptoms are related to the [place] virus, since lack of fever, or any shortness of breath.   Iwill prescribe some a prescription ocugh medication, called Tessalon to help with the cough. Take OTC Cepacol throat lozenges to help with sore throat. If symptoms persist, worsen, or you develop any new symptoms, go to the ER.    Also, on the"Additional comments" part of the Evisit questionnaire, you have stated that you have  "LOWER" respiratory infection symptoms. Lower vs upper respiratory symptoms are important in assessing the needed for COVID-19 testing. If you do have LOWER respiratory infection symptoms, PLEASE GO TO THE ER FOR TESTING.  Coronavirus disease 2019 (COVID-19) is a respiratory illness that can spread from person to person. The virus that causes COVID-19 is a new virus that was first identified in the country of [place] but is now found in multiple other countries and has spread to the [place].  Symptoms associated with the virus are mild to severe fever, cough, and shortness of breath. There is currently no vaccine to protect against COVID-19, and there is no specific antiviral treatment for the virus.  It is vitally important that if you feel that you have an infection such as this virus or any other virus that you stay home and away from places where you may spread it to others.  Currently, not all patients are not being tested. If the symptoms are mild and there is not a known exposure, performing the test is not indicated.  You can use medication such as A prescription inhaler called Albuterol MDI 90 mcg /actuation 2 puffs every 4 hours as needed for shortness of breath, wheezing, cough  Reduce your risk of any infection by using the same precautions used for avoiding the common cold or flu:  [name] Wash your hands often with soap and warm water for at least 20 seconds.  If soap and water  are not readily available, use an alcohol-based hand sanitizer with at least 60% alcohol.  . If coughing or sneezing, cover your mouth and nose by coughing or sneezing into the elbow areas of your shirt or coat, into a tissue or into your sleeve (not your hands). . Avoid shaking hands with others and consider head nods or verbal greetings only. . Avoid touching your eyes, nose, or mouth with unwashed hands.  . Avoid close contact with people who are sick. . If you have some symptoms but not all symptoms, continue to monitor your condition at home . If your symptoms worsen over the next 48 hours seek medical attention through the Emergency Department . If you are having a medical emergency, call 911.  HOME CARE . Only take medications as instructed by your medical team. . Drink plenty of fluids and get plenty of rest. . A steam or ultrasonic humidifier can help if you have congestion.   GET HELP RIGHT AWAY IF: . You develop worsening fever. . You become short of breath . You cough up blood. . Your symptoms become more severe MAKE SURE YOU   Understand these instructions.  Will watch your condition.  Will get help right away if you are not doing well or get worse.  Your e-visit answers were reviewed by a board certified advanced clinical practitioner to complete your personal care plan.  Depending on the condition, your plan could have included both over the counter or prescription medications.  If there  is a problem please reply once you have received a response from your provider. Your safety is important to [place].  If you have drug allergies check your prescription carefully.    You can use MyChart to ask questions about today's visit, request a non-urgent call back, or ask for a work or school excuse for 24 hours related to this e-Visit. If it has been greater than 24 hours you will need to follow up with your provider, or enter a new e-Visit to address those concerns. You will get an  e-mail in the next two days asking about your experience.  I hope that your e-visit has been valuable and will speed your recovery. Thank you for using e-visits.   I spent 7 min in completion of this note- SO

## 2018-04-08 ENCOUNTER — Telehealth: Payer: Self-pay | Admitting: Family

## 2018-04-08 ENCOUNTER — Other Ambulatory Visit: Payer: Self-pay

## 2018-04-08 DIAGNOSIS — B349 Viral infection, unspecified: Secondary | ICD-10-CM

## 2018-04-08 DIAGNOSIS — R6889 Other general symptoms and signs: Secondary | ICD-10-CM

## 2018-04-08 NOTE — Progress Notes (Signed)
Based on what you shared with me, I feel that you are considered high risk for Corona virus virus because of a known exposure, fever, shortness of breath and cough.  You should proceed to our testing site at [address] [place] [address].   I have placed an order for you to have the cornoavirus (COVID19) test done.  - You will be tested for (COVID-19) and discharged home on quarantine except to seek medical care if symptoms worsen, and asked to  - Stay home and avoid contact with others until you get your results (4-5 days)  - Avoid travel on public transportation if possible (such as bus, train, or airplane)    Please review the forms below as these are required. PRINT, sign and complete and bring with you if possible to the testing site.     Person Under Monitoring Name: Beth Bowman  Location: [address] [place] [address]   CORONAVIRUS DISEASE 2019 (COVID-19) Guidance for Persons Under Investigation You are being tested for the virus that causes coronavirus disease 2019 (COVID-19). Public health actions are necessary to ensure protection of your health and the health of others, and to prevent further spread of infection. COVID-19 is caused by a virus that can cause symptoms, such as fever, cough, and shortness of breath. The primary transmission from person to person is by coughing or sneezing. On February 20, 2018, the World Health Organization announced a [employer] Emergency of International Concern and on February 21, 2018 the U.S. Department of Health and Human Services declared a public health emergency. If the virus that causesCOVID-19 spreads in the community, it could have severe public health consequences.  As a person under investigation for COVID-19, the [employer] of Health and [employer], Division of [employer] advises you to adhere to the following guidance until your test results are reported to you. If your test result  is positive, you will receive additional information from your provider and your local health department at that time.   Remain at home until you are cleared by your health provider or public health authorities.   Keep a log of visitors to your home using the form provided. Any visitors to your home must be aware of your isolation status.  If you plan to move to a new address or leave the county, notify the local health department in your county.  Call a doctor or seek care if you have an urgent medical need. Before seeking medical care, call ahead and get instructions from the provider before arriving at the medical office, clinic or hospital. Notify them that you are being tested for the virus that causes COVID-19 so arrangements can be made, as necessary, to prevent transmission to others in the healthcare setting. Next, notify the local health department in your county.  If a medical emergency arises and you need to call 911, inform the first responders that you are being tested for the virus that causes COVID-19. Next, notify the local health department in your county.  Adhere to all guidance set forth by the [employer] Division of [employer] for [facility] of patients that is based on guidance from the Center for Disease Control and Prevention with suspected or confirmed COVID-19. It is provided with this guidance for Persons Under Investigation.  Your health and the health of our community are our top priorities. Public Health officials remain available to provide assistance and counseling to you about COVID-19 and  compliance with this guidance.  Provider:Joshalyn Ancheta Hyman Hopes, NP ____________________________________________________________ Date: ____03__/__17___/__2020_______  By signing below, you acknowledge that you have read and agree to comply with this Guidance for Persons Under Investigation. ______________________________________________________________ Date:  ______/_____/_________  WHO DO I CALL? You can find a list of local health departments here: [email or web address] Health Department: ____________________________________________________________________ Contact Name: ________________________________________________________________________ Telephone: ___________________________________________________________________________  Nedra Hai, Division of Public Health, Communicable Disease Branch COVID-19 Guidance for Persons Under Investigation March 29, 2018   Person Under Monitoring Name: Beth Bowman  Location: [address] [place] [address]   Record here the list of visitors to your home since you became ill with respiratory symptoms that led you to consult a health provider:  Visitor Name Date Time In Time Out Did this person come within 6 feet of you? Indicate Y or N Relationship to Person Under Monitoring Phone number Comments   ___/____/____ __:__ AM/PM __:__ AM/PM       ___/____/____ __:__ AM/PM __:__ AM/PM       ___/____/____ __:__ AM/PM __:__ AM/PM       ___/____/____ __:__ AM/PM __:__ AM/PM       ___/____/____ __:__ AM/PM __:__ AM/PM       ___/____/____ __:__ AM/PM __:__ AM/PM       ___/____/____ __:__ AM/PM __:__ AM/PM       ___/____/____ __:__ AM/PM __:__ AM/PM       ___/____/____ __:__ AM/PM __:__ AM/PM       ___/____/____ __:__ AM/PM __:__ AM/PM       ___/____/____ __:__ AM/PM __:__ AM/PM       ___/____/____ __:__ AM/PM __:__ AM/PM       ___/____/____ __:__ AM/PM __:__ AM/PM       ___/____/____ __:__ AM/PM __:__ AM/PM       Nedra Hai, Division of Public Health, Communicable Disease Branch

## 2018-04-15 LAB — NOVEL CORONAVIRUS, NAA: SARS-COV-2, NAA: NOT DETECTED

## 2018-04-20 ENCOUNTER — Telehealth: Payer: Self-pay | Admitting: Family Medicine

## 2018-05-31 NOTE — Progress Notes (Signed)
Greater than 5 minutes, yet less than 10 minutes of time have been spent researching, coordinating, and implementing care for this patient today.  Thank you for the details you included in the comment boxes. Those details are very helpful in determining the best course of treatment for you and help us to provide the best care.  

## 2018-06-04 MED FILL — FLECAINIDE ACETATE 50 MG TA: 50 | 90 days supply | Qty: 180 | Fill #0

## 2018-06-04 MED FILL — METOPROLOL SUCCINATE ER 50: 50 | 90 days supply | Qty: 180 | Fill #0

## 2018-09-26 ENCOUNTER — Encounter: Payer: Self-pay | Admitting: Nurse Practitioner

## 2018-09-26 ENCOUNTER — Telehealth: Payer: Self-pay | Admitting: Nurse Practitioner

## 2018-09-26 DIAGNOSIS — H109 Unspecified conjunctivitis: Secondary | ICD-10-CM

## 2018-09-26 DIAGNOSIS — N390 Urinary tract infection, site not specified: Secondary | ICD-10-CM

## 2018-09-26 MED ORDER — CEPHALEXIN 500 MG PO CAPS: 500.0000 mg | ORAL_CAPSULE | Freq: Two times a day (BID) | ORAL | 0 refills | Status: AC

## 2018-09-26 MED ORDER — POLYMYXIN B-TRIMETHOPRIM 10000-0.1 UNIT/ML-% OP SOLN: 2.0000 [drp] | OPHTHALMIC | 0 refills | Status: AC

## 2018-09-26 MED ORDER — PHENAZOPYRIDINE HCL 100 MG PO TABS: 100.0000 mg | ORAL_TABLET | Freq: Three times a day (TID) | ORAL | 0 refills | Status: AC | PRN

## 2018-09-26 NOTE — Progress Notes (Signed)
We are sorry that you are not feeling well.  Here is how we plan to help!  Based on what you shared with me it looks like you most likely have a simple urinary tract infection.  A UTI (Urinary Tract Infection) is a bacterial infection of the bladder.  Most cases of urinary tract infections are simple to treat but a key part of your care is to encourage you to drink plenty of fluids and watch your symptoms carefully.  I have prescribed Keflex 500 mg twice a day for 7 days. I am also prescribing Pyridium 100mg  to use up to 3 times daily for 3 days.  Your symptoms should gradually improve. Call us if the burning in your urine worsens, you develop worsening fever, back pain or pelvic pain or if your symptoms do not resolve after completing the antibiotic.  Urinary tract infections can be prevented by drinking plenty of water to keep your body hydrated.  Also be sure when you wipe, wipe from front to back and don't hold it in!  If possible, empty your bladder every 4 hours.  Your e-visit answers were reviewed by a board certified advanced clinical practitioner to complete your personal care plan.  Depending on the condition, your plan could have included both over the counter or prescription medications.  If there is a problem please reply  once you have received a response from your provider.  Your safety is important to [place].  If you have drug allergies check your prescription carefully.    You can use MyChart to ask questions about today's visit, request a non-urgent call back, or ask for a work or school excuse for 24 hours related to this e-Visit. If it has been greater than 24 hours you will need to follow up with your provider, or enter a new e-Visit to address those concerns.   You will get an e-mail in the next two days asking about your experience.  I hope that your e-visit has been valuable and will speed your recovery. Thank you for using e-visits.

## 2018-09-26 NOTE — Progress Notes (Signed)

## 2018-10-03 NOTE — Progress Notes (Signed)
Approximately 5 minutes were spent reviewing and documenting in the patient's chart. 

## 2018-10-03 NOTE — Progress Notes (Signed)
Approximately 5 minutes were spent reviewing and documenting in the patient's chart.

## 2018-12-16 ENCOUNTER — Other Ambulatory Visit: Payer: Self-pay | Admitting: Nurse Practitioner

## 2018-12-17 ENCOUNTER — Other Ambulatory Visit: Payer: Self-pay | Admitting: Cardiovascular Disease

## 2018-12-30 ENCOUNTER — Telehealth: Payer: Self-pay | Admitting: Nurse Practitioner

## 2018-12-30 NOTE — Telephone Encounter (Signed)
Left detailed message for patient asking that she call back to schedule her appointment. I advised that Dr. Acie Fredrickson has an opening tomorrow and patient needs soon appointment due to her medications and need for EKG.

## 2019-01-26 ENCOUNTER — Ambulatory Visit (INDEPENDENT_AMBULATORY_CARE_PROVIDER_SITE_OTHER): Payer: No Typology Code available for payment source | Admitting: Cardiovascular Disease

## 2019-01-26 ENCOUNTER — Encounter: Payer: Self-pay | Admitting: Cardiovascular Disease

## 2019-01-26 DIAGNOSIS — I480 Paroxysmal atrial fibrillation: Secondary | ICD-10-CM

## 2019-01-26 NOTE — Progress Notes (Signed)
Error. No show.

## 2019-01-28 ENCOUNTER — Encounter: Payer: Self-pay | Admitting: Cardiovascular Disease

## 2019-02-21 ENCOUNTER — Telehealth: Payer: Self-pay | Admitting: Physician Assistant

## 2019-02-21 NOTE — Telephone Encounter (Signed)
Patient paged out after our answering service complaining of recurrent atrial flutter.  She has very good cardiac awareness and unknown exactly when she goes into atrial flutter.  She went to sleep yesterday without any issue.  [birth year].  She woke up this morning feeling just fine.  Around 8:30 to 9 AM this morning, she feels she went into atrial flutter with palpitation and shaky feeling.  She has not had any atrial flutter in the past year.  She is not on anticoagulation therapy given her low CHA2DS2-VASc score.  Onset of atrial flutter is roughly 6 hours until now.  We discussed various options, I also discussed the case with DOD Dr. Anne Fu, we opted to proceed with pill in the pocket strategy by giving her a single 200 mg dose of flecainide on top of her 50 mg twice daily dosing.  If she does convert, she can follow-up with cardiology service next week.  If her heart rate remains elevated 2-hour after higher dose of flecainide, she has been instructed to contact cardiology again.  If that does happen, I would recommend rate control strategy until we can see her back in the office.

## 2019-03-02 ENCOUNTER — Other Ambulatory Visit: Payer: Self-pay

## 2019-03-02 ENCOUNTER — Ambulatory Visit: Payer: No Typology Code available for payment source | Admitting: Cardiovascular Disease

## 2019-03-02 ENCOUNTER — Encounter: Payer: Self-pay | Admitting: Cardiovascular Disease

## 2019-03-02 VITALS — BP 100/78 | HR 65 | Ht 62.0 in | Wt 217.0 lb

## 2019-03-02 DIAGNOSIS — G4733 Obstructive sleep apnea (adult) (pediatric): Secondary | ICD-10-CM | POA: Diagnosis not present

## 2019-03-02 DIAGNOSIS — I5032 Chronic diastolic (congestive) heart failure: Secondary | ICD-10-CM

## 2019-03-02 DIAGNOSIS — Z6839 Body mass index (BMI) 39.0-39.9, adult: Secondary | ICD-10-CM

## 2019-03-02 DIAGNOSIS — I4892 Unspecified atrial flutter: Secondary | ICD-10-CM | POA: Diagnosis not present

## 2019-03-02 LAB — BASIC METABOLIC PANEL
BUN/Creatinine Ratio: 24 — ABNORMAL HIGH (ref 9–23)
BUN: 22 mg/dL (ref 6–24)
CO2: 20 mmol/L (ref 20–29)
Calcium: 9.5 mg/dL (ref 8.7–10.2)
Chloride: 103 mmol/L (ref 96–106)
Creatinine, Ser: 0.93 mg/dL (ref 0.57–1.00)
GFR calc Af Amer: 78 mL/min/{1.73_m2} (ref >59)
GFR calc non Af Amer: 67 mL/min/{1.73_m2} (ref >59)
Glucose: 108 mg/dL — ABNORMAL HIGH (ref 65–99)
Potassium: 4.3 mmol/L (ref 3.5–5.2)
Sodium: 142 mmol/L (ref 134–144)

## 2019-03-02 LAB — TSH: TSH: 2.45 u[IU]/mL (ref 0.450–4.500)

## 2019-03-02 LAB — MAGNESIUM: Magnesium: 2.1 mg/dL (ref 1.6–2.3)

## 2019-03-02 NOTE — Patient Instructions (Addendum)
Medication Instructions:   *If you need a refill on your cardiac medications before your next appointment, please call your pharmacy*  Lab Work: Your physician recommends that you have lab work today. TSH, BMET, and Mg  If you have labs (blood work) drawn today and your tests are completely normal, you will receive your results only by: [name] MyChart Message (if you have MyChart) OR . A paper copy in the mail If you have any lab test that is abnormal or we need to change your treatment, we will call you to review the results.  Follow-Up: At [facility], you and your health needs are our priority.  As part of our continuing mission to provide you with exceptional heart care, we have created designated Provider Care Teams.  These Care Teams include your primary Cardiologist (physician) and Advanced Practice Providers (APPs -  Physician Assistants and Nurse Practitioners) who all work together to provide you with the care you need, when you need it.  Your next appointment:   1 year(s)  The format for your next appointment:   In Person  Provider:   You may see Kristeen Miss, MD or one of the following Advanced Practice Providers on your designated Care Team:    Tereso Newcomer, PA-C  Vin Ridgeway, [place]  Berton Bon, [place]

## 2019-03-02 NOTE — Progress Notes (Signed)
Cardiology Office Note   Date:  03/02/2019   ID:  Bowman, Beth 04-12-1960, MRN [id number]  PCP:  Dianne Dun, MD  Cardiologist:   Kristeen Miss, MD   No chief complaint on file.  Problem List 1. Atrial flutter ( paroxysmal ) - CHADS2VASC = 2 ( diastolic CHF ,  female)  2. Obstructive sleep apnea. 3.  Chronic diastolic CHF    Beth Bowman is a 59 y.o. female who presents for chest pain .   Last fall, she started having some vague upper left sided chest pain.  occasionall she would have upper back pain. Also has left upper arm pain and tingling. She went to see [name] Francene Boyers, MD ) , he had initially recommended an echo and then eventually recommended a GXT   She did not have insurance so she did not have the tests.    She also has palpitations for the past year She knows that she does not sleep well.  Wakes up in the early AM - has trouble getting back to sleep. Some early morning headaches. Sleepy during the day.   This past Sunday night, she had some palpitations / heart pounding after taking a nap and went to the ER.   She admits that she has been anxious about all of these symptoms   She joined the gym in the fall.  Did not have any CP or shortness of breath with exercise.  When these symptoms started up several months ago, she stopped going to the gym.   She works for [employer].  July 29, 2014:  Beth Bowman was admitted with rapid atrial flutter several weeks ago .    The diagnosis was made when she received adenosine and showed the flutter waves.   was started on metoprolol .   Seems to be better.    Feb 16, 2015:  Beth Bowman  is seen back for weight gain . She has been found have atrial fibrillation. She converted on flecainide. Tolerating only fairly well  ( side effects of blurred vision)  Some nausea  Has had some fluid retention .  Swollen ankles and fingers  Not eating any extra salt .   Sept. 18, 2017:  Tolerating the   Flecainide fairly well.  Some blurry vision Has not had any tachycardia. Rhythm has been stable  Has had some weight gain   Sept. 11, 2018  Beth Bowman is seen for work in visit - is back in A-flutter.  Has a URI . Lots of coughing . Has been doing well Ran a fever last night ,  Wheezing off and on .  Has not missed any Flecainide doses.   Oct. 22, 2018:  Doing better on the higher dose of Flecainide Has lost 15 lbs - has cut her carbs  Hr has been slow - in the 50s on occasion .  Has had another episode of AF since I last saw her.   Lasted 12 hours  Is not on Eliquis ( was have discussed CHADS2VASC )   Nov 14, 2017:   Beth Bowman is diong well. Has lost 46 lbs since I last saw her ( 243 lbs to 197 lbs today )  Has cut carbs .   Has not had any Atrial fib .   Has borderline OSA .  Takes the furosemide and Kdur once a week ( or every other week )  - takes it if she has some fluid retention .  Still eats some salty foods.   Feb. 8, 2021:  Beth Bowman has been having more paroxysmal atrial fibrillation since I last saw her. Has done well over the past year Last Saturday ( on her birthday )  In the AM ,  Felt her HR was faster - HR of 112,   HR was very irreg.   Range from 110-120 Took some ASA,  Drank water.  HR was never very rapid  Call us,  Was told to take Flecainide 200 mg  X 1 Seems to have helped   Has been under lots of stress,  Has not been sleeping well. - perhaps 4 hours a night. Had a party at her office the day prior and ate lots of suger.  She skipped her metoprolol and flecainide the night before  No regular ETOH  intake. Wt today is 217  ( up 20 lbs since last year ) Still avoiding carbs .  Needs to exercise more.   Tries to walk 5 times a week    Wt Readings from Last 3 Encounters:  11/14/17 197 lb (89.4 kg)  11/12/16 243 lb (110.2 kg)  10/02/16 256 lb (116.1 kg)      Past Medical History:  Diagnosis Date  . Migraines   . OSA (obstructive sleep apnea)  06/22/2014   Mild with AHI 6.7/hr    Past Surgical History:  Procedure Laterality Date  . CARDIAC CATHETERIZATION N/A 12/27/2014   Procedure: Left Heart Cath and Coronary Angiography;  Surgeon: Corky Crafts, MD;  Location: [facility] INVASIVE CV LAB;  Service: Cardiovascular;  Laterality: N/A;  . CESAREAN SECTION     x4  . TONSILLECTOMY       Current Outpatient Medications  Medication Sig Dispense Refill  . acetaminophen (TYLENOL) 500 MG tablet Take 1,000 mg by mouth every 8 (eight) hours as needed for mild pain or headache.    . diphenhydrAMINE (BENADRYL) 25 MG tablet Take 25 mg by mouth every 6 (six) hours as needed for allergies.    . flecainide (TAMBOCOR) 50 MG tablet Take 1 tablet (50 mg total) by mouth 2 (two) times daily. Please call our office to schedule yearly appt. 180 tablet 0  . furosemide (LASIX) 40 MG tablet TAKE 1 TABLET BY MOUTH DAILY. (Patient taking differently: As needed) 30 tablet 11  . ibuprofen (ADVIL,MOTRIN) 200 MG tablet Take 200 mg by mouth every 6 (six) hours as needed (pain or migraine).    [name] MAGNESIUM PO Take 1 tablet by mouth daily.    . metoprolol succinate (TOPROL-XL) 50 MG 24 hr tablet Take 1 tablet (50 mg total) by mouth 2 (two) times daily. Please call our office to schedule yearly appt. 180 tablet 0  . potassium chloride SA (K-DUR,KLOR-CON) 20 MEQ tablet Take 20 mEq by mouth daily as needed (when taking Lasix).    [name] tetrahydrozoline 0.05 % ophthalmic solution Place 1 drop into both eyes daily as needed (FOR DRY EYES).      No current facility-administered medications for this visit.   Facility-Administered Medications Ordered in Other Visits  Medication Dose Route Frequency Provider Last Rate Last Admin  . aminophylline injection 75 mg  75 mg Intravenous BID PRN Laurey Morale, MD        Allergies:   Patient has no known allergies.    Social History:  The patient  reports that she has never smoked. She has never used smokeless tobacco. She  reports current alcohol use. She reports that she does  not use drugs.   Family History:  The patient's family history includes Arrhythmia in her father.    ROS:  Please see the history of present illness.    Physical Exam: Blood pressure 100/78, pulse 65, height 5\' 2"  (1.575 m), weight 217 lb (98.4 kg). Body mass index is 39.69 kg/m.  GEN:  Middle age, moderately obese female  HEENT: Normal NECK: No JVD; No carotid bruits LYMPHATICS: No lymphadenopathy CARDIAC: RRR , no murmurs, rubs, gallops RESPIRATORY:  Clear to auscultation without rales, wheezing or rhonchi  ABDOMEN: Soft, non-tender, non-distended MUSCULOSKELETAL:  No edema; No deformity  SKIN: Warm and dry NEUROLOGIC:  Alert and oriented x 3   EKG: March 02, 2019: Normal sinus rhythm.  She has T wave inversions in leads II, 3, aVF, V1 through V6.  There are no changes from her previous EKG.  She has rare premature ventricular contractions.  EKG is unchanged.   Recent Labs: No results found for requested labs within last 8760 hours.    Lipid Panel    Component Value Date/Time   CHOL 155 11/12/2016 0946   TRIG 71 11/12/2016 0946   HDL 50 11/12/2016 0946   CHOLHDL 3.1 11/12/2016 0946   CHOLHDL 3.4 07/09/2014 0337   VLDL 24 07/09/2014 0337   LDLCALC 91 11/12/2016 0946      Wt Readings from Last 3 Encounters:  11/14/17 197 lb (89.4 kg)  11/12/16 243 lb (110.2 kg)  10/02/16 256 lb (116.1 kg)      Other studies Reviewed: Additional studies/ records that were reviewed today include: . Review of the above records demonstrates:    ASSESSMENT AND PLAN:  1.  Atrial flutter:    [id number] is 2 (Diastolic CHF)    she had recurrent atrial flutter.  Occurred in the setting of increased stress and greatly reduced sleep.   She also forgot her flecainide and metoprolol the episode resolved flecainide 1 mg x 1.  She will continue flecainide 50 mg a day.  I advised her to give [place] a call if she starts having frequent  episodes of atrial fibrillation/atrial flutter.  If needed, will increase her flecainide.  We will need to bring her back in for treadmill test if we reduce her dose.  2. OSA:    -I have advised her to work on weight loss.  3. Obesity : He is gained 20 pounds since her last visit.  Strongly encouraged her to continue the diet, exercise, weight loss program.   4. Chronic diastolic CHF:   Stable.  Current medicines are reviewed at length with the patient today.  The patient does not have concerns regarding medicines.    Labs/ tests ordered today include:  No orders of the defined types were placed in this encounter.    Disposition:   FU with me in  1 year     Signed, Mertie Moores, MD  03/02/2019 8:33 AM    [facility] [address], [place], [place]  [address] Phone: [phone]; Fax: [phone]

## 2019-03-17 ENCOUNTER — Other Ambulatory Visit: Payer: Self-pay | Admitting: Cardiovascular Disease

## 2019-04-01 NOTE — Telephone Encounter (Signed)
Note signed 

## 2019-06-16 ENCOUNTER — Other Ambulatory Visit: Payer: Self-pay | Admitting: Cardiovascular Disease

## 2019-10-07 ENCOUNTER — Telehealth: Payer: No Typology Code available for payment source | Admitting: Emergency Medicine

## 2019-10-07 DIAGNOSIS — N3000 Acute cystitis without hematuria: Secondary | ICD-10-CM | POA: Diagnosis not present

## 2019-10-07 MED ORDER — CEPHALEXIN 500 MG PO CAPS: 500.0000 mg | ORAL_CAPSULE | Freq: Two times a day (BID) | ORAL | 0 refills | Status: AC

## 2019-10-07 NOTE — Progress Notes (Addendum)
We are sorry that you are not feeling well.  Here is how we plan to help!  Based on what you shared with me it looks like you most likely have a simple urinary tract infection.  A UTI (Urinary Tract Infection) is a bacterial infection of the bladder.  Most cases of urinary tract infections are simple to treat but a key part of your care is to encourage you to drink plenty of fluids and watch your symptoms carefully.  I have prescribed Keflex 500 mg twice a day for 7 days.  Your symptoms should gradually improve. Call us if the burning in your urine worsens, you develop worsening fever, back pain or pelvic pain or if your symptoms do not resolve after completing the antibiotic.  Urinary tract infections can be prevented by drinking plenty of water to keep your body hydrated.  Also be sure when you wipe, wipe from front to back and don't hold it in!  If possible, empty your bladder every 4 hours.  Your e-visit answers were reviewed by a board certified advanced clinical practitioner to complete your personal care plan.  Depending on the condition, your plan could have included both over the counter or prescription medications.  If there is a problem please reply  once you have received a response from your provider.  Your safety is important to [place].  If you have drug allergies check your prescription carefully.    You can use MyChart to ask questions about today's visit, request a non-urgent call back, or ask for a work or school excuse for 24 hours related to this e-Visit. If it has been greater than 24 hours you will need to follow up with your provider, or enter a new e-Visit to address those concerns.   You will get an e-mail in the next two days asking about your experience.  I hope that your e-visit has been valuable and will speed your recovery. Thank you for using e-visits.   Approximately 5 minutes was spent revieweing patient's chart.

## 2020-01-24 ENCOUNTER — Encounter: Payer: Self-pay | Admitting: Physician Assistant

## 2020-01-24 DIAGNOSIS — I4892 Unspecified atrial flutter: Secondary | ICD-10-CM | POA: Insufficient documentation

## 2020-01-24 DIAGNOSIS — G43909 Migraine, unspecified, not intractable, without status migrainosus: Secondary | ICD-10-CM | POA: Insufficient documentation

## 2020-01-25 ENCOUNTER — Telehealth: Payer: Self-pay | Admitting: Physician Assistant

## 2020-01-25 NOTE — Telephone Encounter (Signed)
Called to discuss with Beth Bowman about Covid symptoms and the use of a monoclonal antibody or remdesivir infusion for those with mild to moderate Covid symptoms and at a high risk of hospitalization.     Pt is qualified for this infusion at the monoclonal antibody infusion center due to co-morbid conditions and/or a member of an at-risk group (HTN, BMI, atrial flutter, unvaccinated), however declines infusion at this time. Symptoms tier reviewed as well as criteria for ending isolation.  Symptoms reviewed that would warrant ED/Hospital evaluation. Preventative practices reviewed. Patient verbalized understanding. Patient advised to call back if he decides that he does want to get infusion. Callback number to the infusion center given. Patient advised to go to Urgent care or ED with severe symptoms. Last date pt would be eligible for infusion is 01/25/19.    Patient Active Problem List   Diagnosis Date Noted  . Morbid obesity (HCC)   . Migraines   . Atrial flutter (HCC)   . Chronic diastolic CHF (congestive heart failure) (HCC) 02/16/2015  . PAF (paroxysmal atrial fibrillation) (HCC) 01/07/2015  . Abnormal EKG   . Ischemic chest pain (HCC)   . Atrial flutter with rapid ventricular response (HCC) 07/08/2014  . Obesity 07/08/2014  . OSA (obstructive sleep apnea) 06/22/2014  . Palpitations 04/14/2014  . Fatigue 04/14/2014  . Atypical chest pain 04/14/2014    Cline Crock PA-C

## 2020-01-27 ENCOUNTER — Telehealth: Payer: No Typology Code available for payment source | Admitting: Nurse Practitioner

## 2020-01-27 DIAGNOSIS — U071 COVID-19: Secondary | ICD-10-CM

## 2020-01-27 NOTE — Progress Notes (Signed)
Based on what you shared with me it looks like you have covid and are not doing well. You should be evaluated in a face to face office visit. You should go to er for evaluation and possible admission if you are not improving.    NOTE: If you entered your credit card information for this eVisit, you will not be charged. You may see a "hold" on your card for the $35 but that hold will drop off and you will not have a charge processed.  If you are having a true medical emergency please call 911.     For an urgent face to face visit, [place] has four urgent care centers for your convenience:   . [facility]    [phone]                  Get Driving Directions  [address] [place], [place] [address] . 10 am to 8 pm Monday-Friday . 12 pm to 8 pm Saturday-Sunday   . [facility] at [facility]  [phone]                  Get Driving Directions  [address] [place], [place] [address] . 8 am to 8 pm Monday-Friday . 9 am to 6 pm Saturday . 11 am to 6 pm Sunday   . [facility] at [facility] [place]  [phone]                  Get Driving Directions   [address] [place], [place] [address] . 8 am to 8 pm Monday-Friday . 8 am to 4 pm Saturday-Sunday    . [facility] at [facility] Directions  [phone]  [address] [place], [place] [address]  . Monday-Friday, 12 PM to 6 PM    Your e-visit answers were reviewed by a board certified advanced clinical practitioner to complete your personal care plan.  Thank you for using e-Visits.

## 2020-01-29 ENCOUNTER — Encounter: Payer: Self-pay | Admitting: Internal Medicine

## 2020-01-29 ENCOUNTER — Ambulatory Visit (INDEPENDENT_AMBULATORY_CARE_PROVIDER_SITE_OTHER): Payer: No Typology Code available for payment source

## 2020-01-29 ENCOUNTER — Other Ambulatory Visit: Payer: Self-pay | Admitting: Internal Medicine

## 2020-01-29 ENCOUNTER — Telehealth (INDEPENDENT_AMBULATORY_CARE_PROVIDER_SITE_OTHER): Payer: No Typology Code available for payment source | Admitting: Internal Medicine

## 2020-01-29 DIAGNOSIS — U071 COVID-19: Secondary | ICD-10-CM

## 2020-01-29 DIAGNOSIS — I480 Paroxysmal atrial fibrillation: Secondary | ICD-10-CM

## 2020-01-29 MED ORDER — PREDNISONE 20 MG PO TABS: 40.0000 mg | ORAL_TABLET | Freq: Every day | ORAL | 0 refills | Status: DC

## 2020-01-29 NOTE — Assessment & Plan Note (Signed)
She is on day 11 and not improving. She is not worsening significantly and does not qualify for ER visit or admission at this time. Oxygen levels are stable and no SOB. Rx prednisone today and checking CXR to rule out concurrent bacterial pneumonia. She is outside the window for monoclonal antibody at this time. She is not vaccinated at this time. Counseled on otc interventions and what to monitor for symptom wise to monitor O2 levels and breathing. Counseled that this could be several more weeks of symptoms given that she is not vaccinated.

## 2020-01-29 NOTE — Assessment & Plan Note (Signed)
She is taking metoprolol and flecainide without missing doses. HR acceptable. Advised to maintain hydration.

## 2020-01-29 NOTE — Progress Notes (Signed)
Virtual Visit via Video Note  I connected with Beth Bowman on 01/29/20 at  9:40 AM EST by a video enabled telemedicine application and verified that I am speaking with the correct person using two identifiers.  The patient and the provider were at separate locations throughout the entire encounter. Patient location: home, Provider location: work   I discussed the limitations of evaluation and management by telemedicine and the availability of in person appointments. The patient expressed understanding and agreed to proceed. The patient and the provider were the only parties present for the visit unless noted in HPI below.  History of Present Illness: The patient is a 60 y.o. female new to clinic with visit for covid-19 positive test 01/19/20. Started 01/19/20 with covid-19 positive and fevers and chills and body aches and cough and diarrhea. She is not vaccinated against covid-19. She was unable to get the monoclonal antibody infusion on 01/25/20 due to demand. She was only eligible for this through 01/25/20. Has not been improving that she felt she should. She did e-visit and they recommended ER or urgent care visit based on symptoms. She did not do this. Denies SOB and oxygen levels at home running 92 to 96% even with activity. Overall it is not improving much. Still poor appetite and fatigue. Cough is intermittent. Diarrhea is only 1-2 times per day but liquid. Has tried otc cold medications and tylenol. Does have A fib and denies missing medications for this. Denies feeling like she is in A fib or with elevated HR >100. HR is running 80-90s. She is still running some fevers to 101 last night and using tylenol for fevers.   PMH, [facility], social history reviewed and updated as appropriate. New patient apt with PCP upcoming beginning of February and will be updated further if needed at that visit.  Observations/Objective: Appearance: lying in bed, breathing appears normal and talking in full sentences,  minimal cough during visit, casual grooming, abdomen does not appear distended, throat not well visualized, memory normal, mental status is A and O times 3  Assessment and Plan: See problem oriented charting  Follow Up Instructions: chest x-ray today, rx prednisone, if pneumonia on chest x-ray will prescribe azithromycin  Visit time 25 minutes in face to face communication with patient and coordination of care, additional 20 minutes spent in record review, coordination or care, ordering tests, communicating/referring to other healthcare professionals, documenting in medical records all on the same day of the visit for total time 45 minutes spent on the visit.   I discussed the assessment and treatment plan with the patient. The patient was provided an opportunity to ask questions and all were answered. The patient agreed with the plan and demonstrated an understanding of the instructions.   The patient was advised to call back or seek an in-person evaluation if the symptoms worsen or if the condition fails to improve as anticipated.  Myrlene Broker, MD

## 2020-02-25 ENCOUNTER — Ambulatory Visit (INDEPENDENT_AMBULATORY_CARE_PROVIDER_SITE_OTHER): Payer: No Typology Code available for payment source | Admitting: Nurse Practitioner

## 2020-02-25 ENCOUNTER — Ambulatory Visit (INDEPENDENT_AMBULATORY_CARE_PROVIDER_SITE_OTHER): Payer: No Typology Code available for payment source

## 2020-02-25 ENCOUNTER — Other Ambulatory Visit: Payer: Self-pay

## 2020-02-25 ENCOUNTER — Encounter: Payer: Self-pay | Admitting: Nurse Practitioner

## 2020-02-25 VITALS — BP 124/80 | HR 90 | Temp 97.6°F | Ht 62.0 in | Wt 222.2 lb

## 2020-02-25 DIAGNOSIS — J1282 Pneumonia due to coronavirus disease 2019: Secondary | ICD-10-CM

## 2020-02-25 DIAGNOSIS — U099 Post covid-19 condition, unspecified: Secondary | ICD-10-CM

## 2020-02-25 DIAGNOSIS — U071 COVID-19: Secondary | ICD-10-CM | POA: Diagnosis not present

## 2020-02-25 DIAGNOSIS — Z0279 Encounter for issue of other medical certificate: Secondary | ICD-10-CM

## 2020-02-25 NOTE — Progress Notes (Signed)
Subjective:  Patient ID: Beth Bowman, female    DOB: April 28, 1960  Age: 60 y.o. MRN: [id number]  CC: Establish Care (New patient/ 7 week f/u post COVID. Pt would like a chest xray after having COVID and pneumonia just to check to make sure there has been improvement. )  Shortness of Breath This is a new problem. The current episode started more than 1 month ago. The problem occurs intermittently. The problem has been gradually improving. Associated symptoms include wheezing. Pertinent negatives include no chest pain, fever, hemoptysis, orthopnea, PND, rhinorrhea, sore throat, sputum production or swollen glands. The symptoms are aggravated by any activity. The patient has no known risk factors for DVT/PE. She has tried prescription cough suppressants and oral steroids (azithromycin) for the symptoms. The treatment provided significant relief. Her past medical history is significant for pneumonia. There is no history of allergies, asthma, CAD, a heart failure, PE or a recent surgery.  reports persistent palpitation. SOB with exertion and cough, but improving slowly especially after completion of oral prednisone.  Reviewed past Medical, Social and Family history today.  Outpatient Medications Prior to Visit  Medication Sig Dispense Refill  . acetaminophen (TYLENOL) 500 MG tablet Take 1,000 mg by mouth every 8 (eight) hours as needed for mild pain or headache.    . diphenhydrAMINE (BENADRYL) 25 MG tablet Take 25 mg by mouth every 6 (six) hours as needed for allergies.    . flecainide (TAMBOCOR) 50 MG tablet Take 1 tablet (50 mg total) by mouth 2 (two) times daily. 180 tablet 3  . furosemide (LASIX) 40 MG tablet TAKE 1 TABLET BY MOUTH DAILY. 30 tablet 9  . ibuprofen (ADVIL,MOTRIN) 200 MG tablet Take 200 mg by mouth every 6 (six) hours as needed (pain or migraine).    [name] MAGNESIUM PO Take 1 tablet by mouth daily.    . metoprolol succinate (TOPROL-XL) 50 MG 24 hr tablet Take 1 tablet (50 mg  total) by mouth 2 (two) times daily. 180 tablet 3  . potassium chloride SA (K-DUR,KLOR-CON) 20 MEQ tablet Take 20 mEq by mouth daily as needed (when taking Lasix).    [name] tetrahydrozoline 0.05 % ophthalmic solution Place 1 drop into both eyes daily as needed (FOR DRY EYES).      Facility-Administered Medications Prior to Visit  Medication Dose Route Frequency Provider Last Rate Last Admin  . aminophylline injection 75 mg  75 mg Intravenous BID PRN Laurey Morale, MD        ROS See HPI  Objective:  BP 124/80 (BP Location: Left Arm, Patient Position: Sitting, Cuff Size: Large)   Pulse 90   Temp 97.6 F (36.4 C) (Temporal)   Ht 5\' 2"  (1.575 m)   Wt 222 lb 3.2 oz (100.8 kg)   SpO2 96%   BMI 40.64 kg/m   Physical Exam Cardiovascular:     Rate and Rhythm: Normal rate and regular rhythm.     Pulses: Normal pulses.     Heart sounds: Normal heart sounds.  Pulmonary:     Effort: Pulmonary effort is normal.     Breath sounds: Normal breath sounds.  Musculoskeletal:     Right lower leg: No edema.     Left lower leg: No edema.  Neurological:     Mental Status: She is oriented to person, place, and time.    Assessment & Plan:  This visit occurred during the SARS-CoV-2 public health emergency.  Safety protocols were in place, including screening questions prior to  the visit, additional usage of staff PPE, and extensive cleaning of exam room while observing appropriate contact time as indicated for disinfecting solutions.   Raneen was seen today for establish care.  Diagnoses and all orders for this visit:  Pneumonia due to COVID-19 virus -     DG Chest 2 View  Post-COVID-19 condition -     DG Chest 2 View  CXR: resolving opacity.  Problem List Items Addressed This Visit      Other   COVID-19    Other Visit Diagnoses    Pneumonia due to COVID-19 virus    -  Primary   Relevant Orders   DG Chest 2 View (Completed)      Follow-up: Return in about 2 weeks (around 03/10/2020)  for CPE (fasting).  Alysia Penna, NP

## 2020-02-25 NOTE — Telephone Encounter (Signed)
I received FMLA via fax from [employer]. Forms have been completed for return to work date on 02/08/20 due to +COVID. VV with Dr.Crawford on 01/29/20. Forms placed in providers box to review and sign.

## 2020-02-25 NOTE — Patient Instructions (Addendum)
Thank you for choosing [facility]  Go to lab for CXR.

## 2020-02-26 NOTE — Telephone Encounter (Signed)
Forms have been signed, Faxed to [employer] @[phone] - , Copy sent to scan &Charged for.   LVM to inform &Original mailed to patient for her records.

## 2020-02-27 ENCOUNTER — Encounter: Payer: Self-pay | Admitting: Nurse Practitioner

## 2020-04-01 ENCOUNTER — Other Ambulatory Visit: Payer: Self-pay | Admitting: Cardiovascular Disease

## 2020-04-13 ENCOUNTER — Encounter: Payer: No Typology Code available for payment source | Admitting: Nurse Practitioner

## 2020-04-18 ENCOUNTER — Other Ambulatory Visit: Payer: Self-pay | Admitting: Cardiovascular Disease

## 2020-04-19 ENCOUNTER — Other Ambulatory Visit: Payer: Self-pay | Admitting: Cardiovascular Disease

## 2020-04-24 ENCOUNTER — Other Ambulatory Visit (HOSPITAL_COMMUNITY): Payer: Self-pay

## 2020-04-24 MED FILL — Metoprolol Succinate Tab ER 24HR 50 MG (Tartrate Equiv): ORAL | 90 days supply | Qty: 180 | Fill #0 | Status: AC

## 2020-05-01 ENCOUNTER — Other Ambulatory Visit: Payer: Self-pay | Admitting: Cardiovascular Disease

## 2020-05-02 ENCOUNTER — Other Ambulatory Visit (HOSPITAL_COMMUNITY): Payer: Self-pay

## 2020-05-02 MED ORDER — FLECAINIDE ACETATE 50 MG PO TABS
ORAL_TABLET | Freq: Two times a day (BID) | ORAL | 2 refills | Status: DC
  Filled 2020-05-02: qty 120, 60d supply, fill #0

## 2020-05-05 NOTE — Progress Notes (Signed)
Cardiology Office Note   Date:  05/06/2020   ID:  Beth Bowman, DOB [birth year], MRN [id number]  PCP:  Anne Ng, NP  Cardiologist:   Kristeen Miss, MD   Chief Complaint  Patient presents with  . Atrial Flutter   Problem List 1. Atrial flutter ( paroxysmal ) - CHADS2VASC = 2 ( diastolic CHF ,  female)  2. Obstructive sleep apnea. 3.  Chronic diastolic CHF    Beth Bowman is a 60 y.o. female who presents for chest pain .   Last fall, she started having some vague upper left sided chest pain.  occasionall she would have upper back pain. Also has left upper arm pain and tingling. She went to see [name] Francene Boyers, MD ) , he had initially recommended an echo and then eventually recommended a GXT   She did not have insurance so she did not have the tests.    She also has palpitations for the past year She knows that she does not sleep well.  Wakes up in the early AM - has trouble getting back to sleep. Some early morning headaches. Sleepy during the day.   This past Sunday night, she had some palpitations / heart pounding after taking a nap and went to the ER.   She admits that she has been anxious about all of these symptoms   She joined the gym in the fall.  Did not have any CP or shortness of breath with exercise.  When these symptoms started up several months ago, she stopped going to the gym.   She works for [employer].  July 29, 2014:  Beth Bowman was admitted with rapid atrial flutter several weeks ago .    The diagnosis was made when she received adenosine and showed the flutter waves.   was started on metoprolol .   Seems to be better.    Feb 16, 2015:  Beth Bowman  is seen back for weight gain . She has been found have atrial fibrillation. She converted on flecainide. Tolerating only fairly well  ( side effects of blurred vision)  Some nausea  Has had some fluid retention .  Swollen ankles and fingers  Not eating any extra salt .    Sept. 18, 2017:  Tolerating the  Flecainide fairly well.  Some blurry vision Has not had any tachycardia. Rhythm has been stable  Has had some weight gain   Sept. 11, 2018  Beth Bowman is seen for work in visit - is back in A-flutter.  Has a URI . Lots of coughing . Has been doing well Ran a fever last night ,  Wheezing off and on .  Has not missed any Flecainide doses.   Oct. 22, 2018:  Doing better on the higher dose of Flecainide Has lost 15 lbs - has cut her carbs  Hr has been slow - in the 50s on occasion .  Has had another episode of AF since I last saw her.   Lasted 12 hours  Is not on Eliquis ( was have discussed CHADS2VASC )   Nov 14, 2017:   Beth Bowman is diong well. Has lost 46 lbs since I last saw her ( 243 lbs to 197 lbs today )  Has cut carbs .   Has not had any Atrial fib .   Has borderline OSA .  Takes the furosemide and Kdur once a week ( or every other week )  - takes it if she  has some fluid retention .  Still eats some salty foods.   Feb. 8, 2021:  Beth Bowman has been having more paroxysmal atrial fibrillation since I last saw her. Has done well over the past year Last Saturday ( on her birthday )  In the AM ,  Felt her HR was faster - HR of 112,   HR was very irreg.   Range from 110-120 Took some ASA,  Drank water.  HR was never very rapid  Call us,  Was told to take Flecainide 200 mg  X 1 Seems to have helped   Has been under lots of stress,  Has not been sleeping well. - perhaps 4 hours a night. Had a party at her office the day prior and ate lots of suger.  She skipped her metoprolol and flecainide the night before  No regular ETOH  intake. Wt today is 217  ( up 20 lbs since last year ) Still avoiding carbs .  Needs to exercise more.   Tries to walk 5 times a week   May 06, 2020 Beth Bowman is seen today for follow up visit  Has a hx of OSA and PAF Had covid in January   Wt is 213 lbs today . Encourage better diet.   More exercise  Fit bit says she is  in the fat burning stage quite a bit, rarely in the cardio range. Likely due to flecainide and metoprolol     Wt Readings from Last 3 Encounters:  05/06/20 213 lb 12.8 oz (97 kg)  02/25/20 222 lb 3.2 oz (100.8 kg)  03/02/19 217 lb (98.4 kg)      Past Medical History:  Diagnosis Date  . Atrial flutter (HCC)   . Migraines   . Morbid obesity (HCC)   . OSA (obstructive sleep apnea) 06/22/2014   Mild with AHI 6.7/hr    Past Surgical History:  Procedure Laterality Date  . CARDIAC CATHETERIZATION N/A 12/27/2014   Procedure: Left Heart Cath and Coronary Angiography;  Surgeon: Corky Crafts, MD;  Location: [facility] INVASIVE CV LAB;  Service: Cardiovascular;  Laterality: N/A;  . CESAREAN SECTION     x4  . TONSILLECTOMY       Current Outpatient Medications  Medication Sig Dispense Refill  . acetaminophen (TYLENOL) 500 MG tablet Take 1,000 mg by mouth every 8 (eight) hours as needed for mild pain or headache.    . diphenhydrAMINE (BENADRYL) 25 MG tablet Take 25 mg by mouth every 6 (six) hours as needed for allergies.    . furosemide (LASIX) 40 MG tablet Take 40 mg by mouth daily as needed.    [name] ibuprofen (ADVIL,MOTRIN) 200 MG tablet Take 200 mg by mouth every 6 (six) hours as needed (pain or migraine).    . potassium chloride SA (K-DUR,KLOR-CON) 20 MEQ tablet Take 20 mEq by mouth daily as needed (when taking Lasix).    . flecainide (TAMBOCOR) 50 MG tablet TAKE 1 TABLET (50 MG TOTAL) BY MOUTH 2 (TWO) TIMES DAILY. 180 tablet 3  . metoprolol succinate (TOPROL-XL) 50 MG 24 hr tablet Take 1 tablet (50 mg total) by mouth 2 (two) times daily. 180 tablet 3   No current facility-administered medications for this visit.   Facility-Administered Medications Ordered in Other Visits  Medication Dose Route Frequency Provider Last Rate Last Admin  . aminophylline injection 75 mg  75 mg Intravenous BID PRN Laurey Morale, MD        Allergies:   Patient has no  known allergies.    Social  History:  The patient  reports that she has never smoked. She has never used smokeless tobacco. She reports current alcohol use. She reports that she does not use drugs.   Family History:  The patient's family history includes Arrhythmia in her father.    ROS:  Please see the history of present illness.    Physical Exam: Blood pressure 130/72, pulse 69, height 5\' 2"  (1.575 m), weight 213 lb 12.8 oz (97 kg), SpO2 97 %.  GEN:  Moderate obesity  HEENT: Normal NECK: No JVD; No carotid bruits LYMPHATICS: No lymphadenopathy CARDIAC: RRR , no murmurs, rubs, gallops RESPIRATORY:  Clear to auscultation without rales, wheezing or rhonchi  ABDOMEN: Soft, non-tender, non-distended MUSCULOSKELETAL:  No edema; No deformity  SKIN: Warm and dry NEUROLOGIC:  Alert and oriented x 3    EKG:   May 06, 2020: Normal sinus rhythm at 69.  Nonspecific ST and T wave changes.  No changes from previous EKG..   Recent Labs: No results found for requested labs within last 8760 hours.    Lipid Panel    Component Value Date/Time   CHOL 155 11/12/2016 0946   TRIG 71 11/12/2016 0946   HDL 50 11/12/2016 0946   CHOLHDL 3.1 11/12/2016 0946   CHOLHDL 3.4 07/09/2014 0337   VLDL 24 07/09/2014 0337   LDLCALC 91 11/12/2016 0946      Wt Readings from Last 3 Encounters:  05/06/20 213 lb 12.8 oz (97 kg)  02/25/20 222 lb 3.2 oz (100.8 kg)  03/02/19 217 lb (98.4 kg)      Other studies Reviewed: Additional studies/ records that were reviewed today include: . Review of the above records demonstrates:    ASSESSMENT AND PLAN:  1.  Atrial flutter:    CHADS2VASC is 2 (Diastolic CHF)    she had recurrent atrial flutter.  Occurred in the setting of increased stress and greatly reduced sleep.   She also forgot her flecainide and metoprolol the episode resolved flecainide 1 mg x 1.  She will continue flecainide 50 mg a day.   2. OSA:     Per primary MD   3. Obesity :  Encouraged continue diet.   More intense  exercise   4. Chronic diastolic CHF:  Stable  Takes lasix only as needed.     Current medicines are reviewed at length with the patient today.  The patient does not have concerns regarding medicines.    Labs/ tests ordered today include:   Orders Placed This Encounter  Procedures  . EKG 12-Lead     Disposition:   FU with me in  1 year     Signed, 04/30/19, MD  05/06/2020 8:57 AM    [facility] [address] [place], [place], [place]  [place] Phone: [phone]; Fax: [phone]

## 2020-05-06 ENCOUNTER — Encounter: Payer: Self-pay | Admitting: Cardiovascular Disease

## 2020-05-06 ENCOUNTER — Other Ambulatory Visit: Payer: Self-pay

## 2020-05-06 ENCOUNTER — Other Ambulatory Visit (HOSPITAL_COMMUNITY): Payer: Self-pay

## 2020-05-06 ENCOUNTER — Ambulatory Visit: Payer: No Typology Code available for payment source | Admitting: Cardiovascular Disease

## 2020-05-06 VITALS — BP 130/72 | HR 69 | Ht 62.0 in | Wt 213.8 lb

## 2020-05-06 DIAGNOSIS — I480 Paroxysmal atrial fibrillation: Secondary | ICD-10-CM | POA: Diagnosis not present

## 2020-05-06 DIAGNOSIS — I5032 Chronic diastolic (congestive) heart failure: Secondary | ICD-10-CM

## 2020-05-06 DIAGNOSIS — I4892 Unspecified atrial flutter: Secondary | ICD-10-CM

## 2020-05-06 DIAGNOSIS — Z6839 Body mass index (BMI) 39.0-39.9, adult: Secondary | ICD-10-CM

## 2020-05-06 MED ORDER — METOPROLOL SUCCINATE ER 50 MG PO TB24
50.0000 mg | ORAL_TABLET | Freq: Two times a day (BID) | ORAL | 3 refills | Status: DC
  Filled 2020-05-06 – 2020-07-17 (×2): qty 180, 90d supply, fill #0
  Filled 2020-10-16: qty 180, 90d supply, fill #1
  Filled 2020-10-17: qty 180, 90d supply, fill #0
  Filled 2021-01-12: qty 180, 90d supply, fill #1
  Filled 2021-04-16 – 2021-04-18 (×3): qty 180, 90d supply, fill #2

## 2020-05-06 MED ORDER — FLECAINIDE ACETATE 50 MG PO TABS
ORAL_TABLET | Freq: Two times a day (BID) | ORAL | 3 refills | Status: DC
  Filled 2020-05-06 – 2020-06-26 (×2): qty 180, 90d supply, fill #0
  Filled 2020-10-01: qty 180, 90d supply, fill #1
  Filled 2020-10-01: qty 180, 90d supply, fill #0
  Filled 2020-12-22: qty 180, 90d supply, fill #1
  Filled 2021-03-19: qty 180, 90d supply, fill #2
  Filled 2021-03-23: qty 180, 90d supply, fill #0

## 2020-05-06 NOTE — Patient Instructions (Signed)

## 2020-06-03 ENCOUNTER — Ambulatory Visit (INDEPENDENT_AMBULATORY_CARE_PROVIDER_SITE_OTHER): Payer: No Typology Code available for payment source | Admitting: Nurse Practitioner

## 2020-06-03 ENCOUNTER — Encounter: Payer: Self-pay | Admitting: Nurse Practitioner

## 2020-06-03 ENCOUNTER — Other Ambulatory Visit: Payer: Self-pay

## 2020-06-03 VITALS — BP 110/80 | HR 80 | Temp 97.5°F | Ht 62.0 in | Wt 209.2 lb

## 2020-06-03 DIAGNOSIS — Z6838 Body mass index (BMI) 38.0-38.9, adult: Secondary | ICD-10-CM | POA: Diagnosis not present

## 2020-06-03 DIAGNOSIS — Z136 Encounter for screening for cardiovascular disorders: Secondary | ICD-10-CM | POA: Diagnosis not present

## 2020-06-03 DIAGNOSIS — Z1211 Encounter for screening for malignant neoplasm of colon: Secondary | ICD-10-CM

## 2020-06-03 DIAGNOSIS — Z0000 Encounter for general adult medical examination without abnormal findings: Secondary | ICD-10-CM

## 2020-06-03 DIAGNOSIS — Z1231 Encounter for screening mammogram for malignant neoplasm of breast: Secondary | ICD-10-CM

## 2020-06-03 DIAGNOSIS — Z13220 Encounter for screening for lipoid disorders: Secondary | ICD-10-CM

## 2020-06-03 LAB — CBC
HCT: 48.7 % — ABNORMAL HIGH (ref 36.0–46.0)
Hemoglobin: 16.4 g/dL — ABNORMAL HIGH (ref 12.0–15.0)
MCHC: 33.6 g/dL (ref 30.0–36.0)
MCV: 89.9 fl (ref 78.0–100.0)
Platelets: 298 10*3/uL (ref 150.0–400.0)
RBC: 5.42 Mil/uL — ABNORMAL HIGH (ref 3.87–5.11)
RDW: 13.3 % (ref 11.5–15.5)
WBC: 7.3 10*3/uL (ref 4.0–10.5)

## 2020-06-03 LAB — LIPID PANEL
Cholesterol: 214 mg/dL — ABNORMAL HIGH (ref 0–200)
HDL: 62.6 mg/dL (ref >39.00)
LDL Cholesterol: 135 mg/dL — ABNORMAL HIGH (ref 0–99)
NonHDL: 150.9
Total CHOL/HDL Ratio: 3
Triglycerides: 78 mg/dL (ref 0.0–149.0)
VLDL: 15.6 mg/dL (ref 0.0–40.0)

## 2020-06-03 LAB — COMPREHENSIVE METABOLIC PANEL
ALT: 11 U/L (ref 0–35)
AST: 14 U/L (ref 0–37)
Albumin: 4.5 g/dL (ref 3.5–5.2)
Alkaline Phosphatase: 69 U/L (ref 39–117)
BUN: 27 mg/dL — ABNORMAL HIGH (ref 6–23)
CO2: 26 mEq/L (ref 19–32)
Calcium: 9.7 mg/dL (ref 8.4–10.5)
Chloride: 101 mEq/L (ref 96–112)
Creatinine, Ser: 0.72 mg/dL (ref 0.40–1.20)
GFR: 90.97 mL/min (ref >60.00)
Glucose, Bld: 105 mg/dL — ABNORMAL HIGH (ref 70–99)
Potassium: 3.5 mEq/L (ref 3.5–5.1)
Sodium: 139 mEq/L (ref 135–145)
Total Bilirubin: 0.8 mg/dL (ref 0.2–1.2)
Total Protein: 7 g/dL (ref 6.0–8.3)

## 2020-06-03 LAB — TSH: TSH: 2.13 u[IU]/mL (ref 0.35–4.50)

## 2020-06-03 LAB — HEMOGLOBIN A1C: Hgb A1c MFr Bld: 5.7 % (ref 4.6–6.5)

## 2020-06-03 NOTE — Progress Notes (Signed)
Subjective:    Patient ID: Beth Bowman, female    DOB: 12-11-1960, 60 y.o.   MRN: [id number]  Patient presents today for CPE   HPI  Sexual History (orientation,birth control, marital status, STD):declined pelvic exam today, no need for STD screen, has agreed to breast exam.  Depression/Suicide: Depression screen [facility] 2/9 06/03/2020  Decreased Interest 0  Down, Depressed, Hopeless 0  PHQ - 2 Score 0  Altered sleeping 3  Tired, decreased energy 1  Change in appetite 0  Feeling bad or failure about yourself  1  Trouble concentrating 0  Moving slowly or fidgety/restless 0  Suicidal thoughts 0  PHQ-9 Score 5  Difficult doing work/chores Not difficult at all   Vision:up to date  Dental:up to date  Immunizations: (TDAP, Hep C screen, Pneumovax, Influenza, zoster)  Health Maintenance  Topic Date Due  . COVID-19 Vaccine (1) Never done  . Tetanus Vaccine  Never done  . Colon Cancer Screening  Never done  . Mammogram  Never done  . Pap Smear  06/03/2020*  . Hepatitis C Screening: USPSTF Recommendation to screen - Ages 18-79 yo.  06/03/2021*  . HIV Screening  06/03/2021*  . Flu Shot  08/22/2020  . HPV Vaccine  Aged Out  *Topic was postponed. The date shown is not the original due date.   Diet:DASH diet Exercise: walking a day Weight:  Wt Readings from Last 3 Encounters:  06/03/20 209 lb 3.2 oz (94.9 kg)  05/06/20 213 lb 12.8 oz (97 kg)  02/25/20 222 lb 3.2 oz (100.8 kg)   Fall Risk: Fall Risk  06/03/2020  Falls in the past year? 0  Number falls in past yr: 0  Injury with Fall? 0  Risk for fall due to : No Fall Risks  Follow up Falls evaluation completed   Medications and allergies reviewed with patient and updated if appropriate.  Patient Active Problem List   Diagnosis Date Noted  . COVID-19 01/29/2020  . Morbid obesity (HCC)   . Migraines   . Atrial flutter (HCC)   . Chronic diastolic CHF (congestive heart failure) (HCC) 02/16/2015  . PAF  (paroxysmal atrial fibrillation) (HCC) 01/07/2015  . Abnormal EKG   . Ischemic chest pain (HCC)   . Atrial flutter with rapid ventricular response (HCC) 07/08/2014  . Obesity 07/08/2014  . OSA (obstructive sleep apnea) 06/22/2014  . Palpitations 04/14/2014  . Fatigue 04/14/2014  . Atypical chest pain 04/14/2014    Current Outpatient Medications on File Prior to Visit  Medication Sig Dispense Refill  . acetaminophen (TYLENOL) 500 MG tablet Take 1,000 mg by mouth every 8 (eight) hours as needed for mild pain or headache.    . diphenhydrAMINE (BENADRYL) 25 MG tablet Take 25 mg by mouth every 6 (six) hours as needed for allergies.    . flecainide (TAMBOCOR) 50 MG tablet TAKE 1 TABLET (50 MG TOTAL) BY MOUTH 2 (TWO) TIMES DAILY. 180 tablet 3  . furosemide (LASIX) 40 MG tablet Take 40 mg by mouth daily as needed.    [name] ibuprofen (ADVIL,MOTRIN) 200 MG tablet Take 200 mg by mouth every 6 (six) hours as needed (pain or migraine).    . metoprolol succinate (TOPROL-XL) 50 MG 24 hr tablet Take 1 tablet (50 mg total) by mouth 2 (two) times daily. 180 tablet 3  . potassium chloride SA (K-DUR,KLOR-CON) 20 MEQ tablet Take 20 mEq by mouth daily as needed (when taking Lasix).     Current Facility-Administered Medications on File  Prior to Visit  Medication Dose Route Frequency Provider Last Rate Last Admin  . aminophylline injection 75 mg  75 mg Intravenous BID PRN Laurey MoraleMcLean, Dalton S, MD        Past Medical History:  Diagnosis Date  . Atrial flutter (HCC)   . Migraines   . Morbid obesity (HCC)   . OSA (obstructive sleep apnea) 06/22/2014   Mild with AHI 6.7/hr    Past Surgical History:  Procedure Laterality Date  . CARDIAC CATHETERIZATION N/A 12/27/2014   Procedure: Left Heart Cath and Coronary Angiography;  Surgeon: Corky CraftsJayadeep S Varanasi, MD;  [facility];  Service: Cardiovascular;  Laterality: N/A;  . CESAREAN SECTION     x4  . TONSILLECTOMY      Social History   Socioeconomic  History  . Marital status: Married    Spouse name: Not on file  . Number of children: Not on file  . Years of education: Not on file  . Highest education level: Not on file  Occupational History  . Occupation: TCTS  Tobacco Use  . Smoking status: Never Smoker  . Smokeless tobacco: Never Used  Vaping Use  . Vaping Use: Never used  Substance and Sexual Activity  . Alcohol use: Yes    Alcohol/week: 0.0 standard drinks    Comment: Rare, no history of abuse  . Drug use: No  . Sexual activity: Not on file  Other Topics Concern  . Not on file  Social History Narrative   Lives in [place] with her husband.   Social Determinants of Health   Financial Resource Strain: Not on file  Food Insecurity: Not on file  Transportation Needs: Not on file  Physical Activity: Not on file  Stress: Not on file  Social Connections: Not on file    Family History  Problem Relation Age of Onset  . Arrhythmia Father   . Premature CHD Neg Hx         Review of Systems  Constitutional: Negative for fever, malaise/fatigue and weight loss.  HENT: Negative for congestion and sore throat.   Eyes:       Negative for visual changes  Respiratory: Negative for cough and shortness of breath.   Cardiovascular: Negative for chest pain, palpitations and leg swelling.  Gastrointestinal: Negative for blood in stool, constipation, diarrhea and heartburn.  Genitourinary: Negative for dysuria, frequency and urgency.  Musculoskeletal: Negative for falls, joint pain and myalgias.  Skin: Negative for rash.  Neurological: Negative for dizziness, sensory change and headaches.  Endo/Heme/Allergies: Does not bruise/bleed easily.  Psychiatric/Behavioral: Negative for depression, substance abuse and suicidal ideas. The patient is not nervous/anxious and does not have insomnia.    Objective:   Vitals:   06/03/20 0814  BP: 110/80  Pulse: 80  Temp: (!) 97.5 F (36.4 C)  SpO2: 99%   Body mass index is 38.26  kg/m.  Physical Examination:  Physical Exam Vitals reviewed. Exam conducted with a chaperone present.  Constitutional:      General: She is not in acute distress.    Appearance: She is obese.  HENT:     Right Ear: Tympanic membrane, ear canal and external ear normal.     Left Ear: Tympanic membrane, ear canal and external ear normal.  Eyes:     General: No scleral icterus.    Extraocular Movements: Extraocular movements intact.     Conjunctiva/sclera: Conjunctivae normal.  Neck:     Thyroid: No thyromegaly.  Cardiovascular:  Rate and Rhythm: Normal rate and regular rhythm.     Pulses: Normal pulses.     Heart sounds: Normal heart sounds.  Pulmonary:     Effort: Pulmonary effort is normal.     Breath sounds: Normal breath sounds.  Chest:     Chest wall: No tenderness.  Breasts:     Right: Normal. No axillary adenopathy or supraclavicular adenopathy.     Left: Normal. No axillary adenopathy or supraclavicular adenopathy.    Abdominal:     General: Bowel sounds are normal. There is no distension.     Palpations: Abdomen is soft.     Tenderness: There is no abdominal tenderness.  Genitourinary:    Comments: declined Musculoskeletal:        General: No tenderness. Normal range of motion.     Cervical back: Normal range of motion and neck supple.  Lymphadenopathy:     Cervical: No cervical adenopathy.     Upper Body:     Right upper body: No supraclavicular, axillary or pectoral adenopathy.     Left upper body: No supraclavicular, axillary or pectoral adenopathy.  Skin:    General: Skin is warm and dry.  Neurological:     Mental Status: She is alert and oriented to person, place, and time.  Psychiatric:        Mood and Affect: Mood normal.        Behavior: Behavior normal.        Thought Content: Thought content normal.        Judgment: Judgment normal.    ASSESSMENT and PLAN: This visit occurred during the SARS-CoV-2 public health emergency.  Safety protocols  were in place, including screening questions prior to the visit, additional usage of staff PPE, and extensive cleaning of exam room while observing appropriate contact time as indicated for disinfecting solutions.   Natesha was seen today for annual exam.  Diagnoses and all orders for this visit:  Preventative health care -     CBC -     Comprehensive metabolic panel -     Lipid panel -     Cologuard; Future  Class 2 severe obesity due to excess calories with serious comorbidity and body mass index (BMI) of 38.0 to 38.9 in adult (HCC) -     TSH -     Hemoglobin A1c -     Vitamin D 1,25 dihydroxy -     Proinsulin/insulin ratio -     Amb Ref to Medical Weight Management  Breast cancer screening by mammogram -     MM 3D SCREEN BREAST BILATERAL; Future  Encounter for lipid screening for cardiovascular disease -     Lipid panel  Colon cancer screening -     Cologuard; Future      Problem List Items Addressed This Visit      Other   Morbid obesity (HCC)    Other Visit Diagnoses    Preventative health care    -  Primary   Relevant Orders   CBC (Completed)   Comprehensive metabolic panel (Completed)   Lipid panel (Completed)   Cologuard   Breast cancer screening by mammogram       Relevant Orders   MM 3D SCREEN BREAST BILATERAL   Encounter for lipid screening for cardiovascular disease       Relevant Orders   Lipid panel (Completed)   Colon cancer screening       Relevant Orders   Cologuard  Follow up: Return in about 1 year (around 06/03/2021) for CPE (fasting).  Beth Penna, NP

## 2020-06-03 NOTE — Patient Instructions (Addendum)
Go to lab for blood draw  You will be contact about mammogram cologuard and appt with weight management clinic.  Preventive Care 56-60 Years Old, Female Preventive care refers to lifestyle choices and visits with your health care provider that can promote health and wellness. This includes:  A yearly physical exam. This is also called an annual wellness visit.  Regular dental and eye exams.  Immunizations.  Screening for certain conditions.  Healthy lifestyle choices, such as: ? Eating a healthy diet. ? Getting regular exercise. ? Not using drugs or products that contain nicotine and tobacco. ? Limiting alcohol use. What can I expect for my preventive care visit? Physical exam Your health care provider will check your:  Height and weight. These may be used to calculate your BMI (body mass index). BMI is a measurement that tells if you are at a healthy weight.  Heart rate and blood pressure.  Body temperature.  Skin for abnormal spots. Counseling Your health care provider may ask you questions about your:  Past medical problems.  Family's medical history.  Alcohol, tobacco, and drug use.  Emotional well-being.  Home life and relationship well-being.  Sexual activity.  Diet, exercise, and sleep habits.  Work and work Statistician.  Access to firearms.  Method of birth control.  Menstrual cycle.  Pregnancy history. What immunizations do I need? Vaccines are usually given at various ages, according to a schedule. Your health care provider will recommend vaccines for you based on your age, medical history, and lifestyle or other factors, such as travel or where you work.   What tests do I need? Blood tests  Lipid and cholesterol levels. These may be checked every 5 years, or more often if you are over 60 years old.  Hepatitis C test.  Hepatitis B test. Screening  Lung cancer screening. You may have this screening every year starting at age 60 if you have  a 30-pack-year history of smoking and currently smoke or have quit within the past 15 years.  Colorectal cancer screening. ? All adults should have this screening starting at age 21 and continuing until age 42. ? Your health care provider may recommend screening at age 6 if you are at increased risk. ? You will have tests every 1-10 years, depending on your results and the type of screening test.  Diabetes screening. ? This is done by checking your blood sugar (glucose) after you have not eaten for a while (fasting). ? You may have this done every 1-3 years.  Mammogram. ? This may be done every 1-2 years. ? Talk with your health care provider about when you should start having regular mammograms. This may depend on whether you have a family history of breast cancer.  BRCA-related cancer screening. This may be done if you have a family history of breast, ovarian, tubal, or peritoneal cancers.  Pelvic exam and Pap test. ? This may be done every 3 years starting at age 21. ? Starting at age 53, this may be done every 5 years if you have a Pap test in combination with an HPV test. Other tests  STD (sexually transmitted disease) testing, if you are at risk.  Bone density scan. This is done to screen for osteoporosis. You may have this scan if you are at high risk for osteoporosis. Talk with your health care provider about your test results, treatment options, and if necessary, the need for more tests. Follow these instructions at home: Eating and drinking  Eat a  diet that includes fresh fruits and vegetables, whole grains, lean protein, and low-fat dairy products.  Take vitamin and mineral supplements as recommended by your health care provider.  Do not drink alcohol if: ? Your health care provider tells you not to drink. ? You are pregnant, may be pregnant, or are planning to become pregnant.  If you drink alcohol: ? Limit how much you have to 0-1 drink a day. ? Be aware of how  much alcohol is in your drink. In the [place] one drink equals one 12 oz bottle of beer (355 mL), one 5 oz glass of wine (148 mL), or one 1 oz glass of hard liquor (44 mL).   Lifestyle  Take daily care of your teeth and gums. Brush your teeth every morning and night with fluoride toothpaste. Floss one time each day.  Stay active. Exercise for at least 30 minutes 5 or more days each week.  Do not use any products that contain nicotine or tobacco, such as cigarettes, e-cigarettes, and chewing tobacco. If you need help quitting, ask your health care provider.  Do not use drugs.  If you are sexually active, practice safe sex. Use a condom or other form of protection to prevent STIs (sexually transmitted infections).  If you do not wish to become pregnant, use a form of birth control. If you plan to become pregnant, see your health care provider for a prepregnancy visit.  If told by your health care provider, take low-dose aspirin daily starting at age 54.  Find healthy ways to cope with stress, such as: ? Meditation, yoga, or listening to music. ? Journaling. ? Talking to a trusted person. ? Spending time with friends and family. Safety  Always wear your seat belt while driving or riding in a vehicle.  Do not drive: ? If you have been drinking alcohol. Do not ride with someone who has been drinking. ? When you are tired or distracted. ? While texting.  Wear a helmet and other protective equipment during sports activities.  If you have firearms in your house, make sure you follow all gun safety procedures. What's next?  Visit your health care provider once a year for an annual wellness visit.  Ask your health care provider how often you should have your eyes and teeth checked.  Stay up to date on all vaccines. This information is not intended to replace advice given to you by your health care provider. Make sure you discuss any questions you have with your health care  provider. Document Revised: 10/13/2019 Document Reviewed: 09/19/2017 Elsevier Patient Education  2021 [employer].

## 2020-06-07 LAB — VITAMIN D 1,25 DIHYDROXY
Vitamin D 1, 25 (OH)2 Total: 64 pg/mL (ref 18–72)
Vitamin D2 1, 25 (OH)2: <8 pg/mL
Vitamin D3 1, 25 (OH)2: 64 pg/mL

## 2020-06-17 LAB — PROINSULIN/INSULIN RATIO
Insulin: 5.1 u[IU]/mL
Proinsulin: 3 pmol/L

## 2020-06-27 ENCOUNTER — Other Ambulatory Visit (HOSPITAL_COMMUNITY): Payer: Self-pay

## 2020-06-30 ENCOUNTER — Other Ambulatory Visit (HOSPITAL_COMMUNITY): Payer: Self-pay

## 2020-06-30 ENCOUNTER — Other Ambulatory Visit: Payer: Self-pay | Admitting: Cardiovascular Disease

## 2020-07-01 ENCOUNTER — Other Ambulatory Visit (HOSPITAL_COMMUNITY): Payer: Self-pay

## 2020-07-06 ENCOUNTER — Other Ambulatory Visit (HOSPITAL_COMMUNITY): Payer: Self-pay

## 2020-07-18 ENCOUNTER — Other Ambulatory Visit (HOSPITAL_COMMUNITY): Payer: Self-pay

## 2020-10-01 ENCOUNTER — Other Ambulatory Visit (HOSPITAL_COMMUNITY): Payer: Self-pay

## 2020-10-03 ENCOUNTER — Other Ambulatory Visit (HOSPITAL_COMMUNITY): Payer: Self-pay

## 2020-10-17 ENCOUNTER — Other Ambulatory Visit (HOSPITAL_COMMUNITY): Payer: Self-pay

## 2020-11-09 ENCOUNTER — Telehealth: Payer: Self-pay | Admitting: Nurse Practitioner

## 2020-11-09 NOTE — Telephone Encounter (Signed)
Pt called, covid positive, wondering if she can get the anti viral medication

## 2020-11-10 ENCOUNTER — Other Ambulatory Visit (HOSPITAL_COMMUNITY): Payer: Self-pay

## 2020-11-10 ENCOUNTER — Telehealth: Payer: No Typology Code available for payment source | Admitting: Physician Assistant

## 2020-11-10 DIAGNOSIS — U071 COVID-19: Secondary | ICD-10-CM

## 2020-11-10 MED ORDER — MOLNUPIRAVIR EUA 200MG CAPSULE
4.0000 | ORAL_CAPSULE | Freq: Two times a day (BID) | ORAL | 0 refills | Status: AC
  Filled 2020-11-10: qty 40, 5d supply, fill #0

## 2020-11-10 MED ORDER — BENZONATATE 100 MG PO CAPS
100.0000 mg | ORAL_CAPSULE | Freq: Three times a day (TID) | ORAL | 0 refills | Status: DC | PRN
  Filled 2020-11-10: qty 30, 10d supply, fill #0

## 2020-11-10 NOTE — Telephone Encounter (Signed)
Pt notified and states she would like a visit today if possible, informed patient none of our providers had any availability today and that I could try to schedule her for the next available. Pt states she does not want to wait because she wants medication right away to stay on top of her sickness. Pt states she will try to get a E-visit through MyChart.

## 2020-11-10 NOTE — Patient Instructions (Signed)
Hello Beth Bowman,  You are being placed in the home monitoring program for COVID-19 (commonly known as Coronavirus).  This is because you are suspected to have the virus or are known to have the virus.  If you are unsure which group you fall into call your clinic.    As part of this program, you'll answer a daily questionnaire in the MyChart mobile app. You'll receive a notification through the MyChart app when the questionnaire is available. When you log in to MyChart, you'll see the tasks in your To Do activity.       Clinicians will see any answers that are concerning and take appropriate steps.  If at any point you are having a medical emergency, call 911.  If otherwise concerned call your clinic instead of coming into the clinic or hospital.  To keep from spreading the disease you should: Stay home and limit contact with other people as much as possible.  Wash your hands frequently. Cover your coughs and sneezes with a tissue, and throw used tissues in the trash.   Clean and disinfect frequently touched surfaces and objects.    Take care of yourself by: Staying home Resting Drinking fluids Take fever-reducing medications (Tylenol/Acetaminophen and Ibuprofen)  For more information on the disease go to the Centers for Disease Control and Prevention website     You are being prescribed MOLNUPIRAVIR for COVID-19 infection.   Please call the pharmacy or go through the drive through vs going inside if you are picking up the mediation yourself to prevent further spread. If prescribed to a [facility] affiliated pharmacy, a pharmacist will bring the medication out to your car.   ADMINISTRATION INSTRUCTIONS: Take with or without food. Swallow the tablets whole. Don't chew, crush, or break the medications because it might not work as well  For each dose of the medication, you should be taking FOUR tablets at one time, TWICE a day   Finish your full five-day course of Molnupiravir even if you  feel better before you're done. Stopping this medication too early can make it less effective to prevent severe illness related to COVID19.    Molnupiravir is prescribed for YOU ONLY. Don't share it with others, even if they have similar symptoms as you. This medication might not be right for everyone.   Make sure to take steps to protect yourself and others while you're taking this medication in order to get well soon and to prevent others from getting sick with COVID-19.   **If you are of childbearing potential (any gender) - it is advised to not get pregnant while taking this medication and recommended that condoms are used for female partners the next 3 months after taking the medication out of extreme caution    COMMON SIDE EFFECTS: Diarrhea Nausea  Dizziness    If your COVID-19 symptoms get worse, get medical help right away. Call 911 if you experience symptoms such as worsening cough, trouble breathing, chest pain that doesn't go away, confusion, a hard time staying awake, and pale or blue-colored skin. This medication won't prevent all COVID-19 cases from getting worse.   Can take to lessen severity: Vit C 500mg  twice daily Quercertin 250-500mg  twice daily Zinc 75-100mg  daily Melatonin 3-6 mg at bedtime Vit D3 1000-2000 IU daily Aspirin 81 mg daily with food Optional: Famotidine 20mg  daily Also can add tylenol/ibuprofen as needed for fevers and body aches May add Mucinex or Mucinex DM as needed for cough/congestion  10 Things You Can Do  to Manage Your COVID-19 Symptoms at Home If you have possible or confirmed COVID-19 Stay home except to get medical care. Monitor your symptoms carefully. If your symptoms get worse, call your healthcare provider immediately. Get rest and stay hydrated. If you have a medical appointment, call the healthcare provider ahead of time and tell them that you have or may have COVID-19. For medical emergencies, call 911 and notify the dispatch  personnel that you have or may have COVID-19. Cover your cough and sneezes with a tissue or use the inside of your elbow. Wash your hands often with soap and water for at least 20 seconds or clean your hands with an alcohol-based hand sanitizer that contains at least 60% alcohol. As much as possible, stay in a specific room and away from other people in your home. Also, you should use a separate bathroom, if available. If you need to be around other people in or outside of the home, wear a mask. Avoid sharing personal items with other people in your household, like dishes, towels, and bedding. Clean all surfaces that are touched often, like counters, tabletops, and doorknobs. Use household cleaning sprays or wipes according to the label instructions. cdc.gov/coronavirus 08/07/2019 This information is not intended to replace advice given to you by your health care provider. Make sure you discuss any questions you have with your health care provider. Document Revised: 05/26/2020 Document Reviewed: 05/26/2020 Elsevier Patient Education  2022 Elsevier Inc.  

## 2020-11-10 NOTE — Progress Notes (Signed)
Virtual Visit Consent   Jama Mcmiller [facility], you are scheduled for a virtual visit with a [facility] provider today.     Just as with appointments in the office, your consent must be obtained to participate.  Your consent will be active for this visit and any virtual visit you may have with one of our providers in the next 365 days.     If you have a MyChart account, a copy of this consent can be sent to you electronically.  All virtual visits are billed to your insurance company just like a traditional visit in the office.    As this is a virtual visit, video technology does not allow for your provider to perform a traditional examination.  This may limit your provider's ability to fully assess your condition.  If your provider identifies any concerns that need to be evaluated in person or the need to arrange testing (such as labs, EKG, etc.), we will make arrangements to do so.     Although advances in technology are sophisticated, we cannot ensure that it will always work on either your end or our end.  If the connection with a video visit is poor, the visit may have to be switched to a telephone visit.  With either a video or telephone visit, we are not always able to ensure that we have a secure connection.     I need to obtain your verbal consent now.   Are you willing to proceed with your visit today?    Beth Bowman has provided verbal consent on 11/10/2020 for a virtual visit (video or telephone).   [name], PA-C   Date: 11/10/2020 3:23 PM   Virtual Visit via Video Note   I, [name], connected with  Beth Bowman  ([id number], 27-Apr-1960) on 11/10/20 at  3:00 PM EDT by a video-enabled telemedicine application and verified that I am speaking with the correct person using two identifiers.  Location: Patient: Virtual Visit Location Patient: Home Provider: Virtual Visit Location Provider: Home Office   I discussed the limitations of  evaluation and management by telemedicine and the availability of in person appointments. The patient expressed understanding and agreed to proceed.    History of Present Illness: Beth Bowman is a 60 y.o. who identifies as a female who was assigned female at birth, and is being seen today for Covid 44.  HPI: URI  This is a new problem. Episode onset: Tested positive for covid on Tuesday; symptoms started Monday night with a mild sore throat. The problem has been gradually worsening. The maximum temperature recorded prior to her arrival was 101 - 101.9 F (101.5). The fever has been present for 1 to 2 days. Associated symptoms include congestion, coughing (started last night; somewhat productive), headaches, rhinorrhea, sinus pain and a sore throat. Pertinent negatives include no ear pain or plugged ear sensation. Associated symptoms comments: Chills, body aches, fatigue. Treatments tried: mucinex,ibuprofen, tylenol. The treatment provided no relief.     Problems:  Patient Active Problem List   Diagnosis Date Noted   COVID-19 01/29/2020   Morbid obesity (HCC)    Migraines    Atrial flutter (HCC)    Chronic diastolic CHF (congestive heart failure) (HCC) 02/16/2015   PAF (paroxysmal atrial fibrillation) (HCC) 01/07/2015   Abnormal EKG    Ischemic chest pain (HCC)    Atrial flutter with rapid ventricular response (HCC) 07/08/2014   Obesity 07/08/2014   Palpitations 04/14/2014  Allergies: No Known Allergies Medications:  Current Outpatient Medications:    benzonatate (TESSALON) 100 MG capsule, Take 1 capsule (100 mg total) by mouth 3 (three) times daily as needed., Disp: 30 capsule, Rfl: 0   molnupiravir EUA (LAGEVRIO) 200 mg CAPS capsule, Take 4 capsules (800 mg total) by mouth 2 (two) times daily for 5 days., Disp: 40 capsule, Rfl: 0   acetaminophen (TYLENOL) 500 MG tablet, Take 1,000 mg by mouth every 8 (eight) hours as needed for mild pain or headache., Disp: , Rfl:     diphenhydrAMINE (BENADRYL) 25 MG tablet, Take 25 mg by mouth every 6 (six) hours as needed for allergies., Disp: , Rfl:    flecainide (TAMBOCOR) 50 MG tablet, TAKE 1 TABLET (50 MG TOTAL) BY MOUTH 2 (TWO) TIMES DAILY., Disp: 180 tablet, Rfl: 3   furosemide (LASIX) 40 MG tablet, Take 40 mg by mouth daily as needed., Disp: , Rfl:    ibuprofen (ADVIL,MOTRIN) 200 MG tablet, Take 200 mg by mouth every 6 (six) hours as needed (pain or migraine)., Disp: , Rfl:    metoprolol succinate (TOPROL-XL) 50 MG 24 hr tablet, Take 1 tablet (50 mg total) by mouth 2 (two) times daily., Disp: 180 tablet, Rfl: 3   potassium chloride SA (K-DUR,KLOR-CON) 20 MEQ tablet, Take 20 mEq by mouth daily as needed (when taking Lasix)., Disp: , Rfl:  No current facility-administered medications for this visit.  Facility-Administered Medications Ordered in Other Visits:    aminophylline injection 75 mg, 75 mg, Intravenous, BID PRN, Laurey Morale, MD  Observations/Objective: Patient is well-developed, well-nourished in no acute distress.  Resting comfortably at home.  Head is normocephalic, atraumatic.  No labored breathing.  Speech is clear and coherent with logical content.  Patient is alert and oriented at baseline.    Assessment and Plan: 1. COVID-19 - molnupiravir EUA (LAGEVRIO) 200 mg CAPS capsule; Take 4 capsules (800 mg total) by mouth 2 (two) times daily for 5 days.  Dispense: 40 capsule; Refill: 0 - MyChart COVID-19 home monitoring program; Future - benzonatate (TESSALON) 100 MG capsule; Take 1 capsule (100 mg total) by mouth 3 (three) times daily as needed.  Dispense: 30 capsule; Refill: 0  - Continue OTC symptomatic management of choice - Will send OTC vitamins and supplement information through AVS - Molnupiravir prescribed - Tessalon perles for cough - Patient enrolled in MyChart symptom monitoring - Push fluids - Rest as needed - Discussed return precautions and when to seek in-person evaluation, sent  via AVS as well   Follow Up Instructions: I discussed the assessment and treatment plan with the patient. The patient was provided an opportunity to ask questions and all were answered. The patient agreed with the plan and demonstrated an understanding of the instructions.  A copy of instructions were sent to the patient via MyChart unless otherwise noted below.    The patient was advised to call back or seek an in-person evaluation if the symptoms worsen or if the condition fails to improve as anticipated.  Time:  I spent 16 minutes with the patient via telehealth technology discussing the above problems/concerns.    [name], PA-C

## 2020-11-11 NOTE — Telephone Encounter (Signed)
Pt seen by a provider on 11/10/20

## 2020-12-22 ENCOUNTER — Other Ambulatory Visit (HOSPITAL_COMMUNITY): Payer: Self-pay

## 2020-12-23 ENCOUNTER — Other Ambulatory Visit (HOSPITAL_COMMUNITY): Payer: Self-pay

## 2021-01-13 ENCOUNTER — Other Ambulatory Visit (HOSPITAL_COMMUNITY): Payer: Self-pay

## 2021-01-30 ENCOUNTER — Telehealth: Payer: No Typology Code available for payment source | Admitting: Physician Assistant

## 2021-01-30 ENCOUNTER — Other Ambulatory Visit: Payer: Self-pay

## 2021-01-30 DIAGNOSIS — R3989 Other symptoms and signs involving the genitourinary system: Secondary | ICD-10-CM

## 2021-01-30 MED ORDER — CEPHALEXIN 500 MG PO CAPS
500.0000 mg | ORAL_CAPSULE | Freq: Two times a day (BID) | ORAL | 0 refills | Status: AC
  Filled 2021-01-30: qty 14, 7d supply, fill #0

## 2021-01-30 NOTE — Progress Notes (Signed)

## 2021-01-30 NOTE — Progress Notes (Signed)
I have spent 5 minutes in review of e-visit questionnaire, review and updating patient chart, medical decision making and response to patient.   Talissa Apple Cody Dionte [place], PA-C    

## 2021-03-11 ENCOUNTER — Telehealth: Payer: Self-pay | Admitting: Physician Assistant

## 2021-03-11 NOTE — Telephone Encounter (Signed)
° °  The patient called the answering service after-hours today. Chart reviewed, followed primarily for hx of atrial flutter, typically does very well on flecainide and metoprolol.  [name] works for [employer].   She states she has not had an episode in a long time, over a year by her report. She is not on anticoagulation, appears patient previously declined. Last night she developed erratic HR and palpitations consistent with her known atrial fib/flutter symptoms. HR ranging 75-150 which seems more consistent with flutter. Last BPs 114/100, 98/81. She had eaten pizza and cookie yesterday which was unusual as she's typically been doing low carb. She also used a sleep patch with melatonin and valerian last night. She confirms she is on flecainide 50mg  BID and metoprolol 50mg  BID. She took them last around 5:30am in hopes to convert but sx still ongoing. (She recalls she previously had to take extra flecainide to convert.) No acute CP, dyspnea, dizziness or syncope, just feels jittery and uncomfortable when out of rhythm. Discussed with Dr. with EP who recommends she take an additional 2 flecainide now (100mg ) and extra metoprolol now (50mg ), then resume normal dosing this evening and follow symptoms. If still having symptoms tomorrow AM she should call back. Advised to avoid OTC sleep patch for now in case of herbal drug interactions. May need to consider initiation of OAC if she does not convert (and even potentially going forward as well). Will route to afib clinic to see if we can get her in early next week, will also route to Dr. to advise on any additional recommendations.  The patient verbalized understanding and gratitude.  Ladona Ridgel, PA-C

## 2021-03-13 NOTE — Telephone Encounter (Signed)
Called and left message for patient to call back to schedule appt with AFib Clinic. 

## 2021-03-15 NOTE — Telephone Encounter (Signed)
Called and left message for patient to call back.

## 2021-03-16 NOTE — Telephone Encounter (Signed)
Pt feeling much better does not feel she needs to be seen at this point. Will call if AF burden increases.

## 2021-03-20 ENCOUNTER — Other Ambulatory Visit (HOSPITAL_COMMUNITY): Payer: Self-pay

## 2021-03-23 ENCOUNTER — Other Ambulatory Visit: Payer: Self-pay

## 2021-03-24 ENCOUNTER — Other Ambulatory Visit: Payer: Self-pay

## 2021-04-17 ENCOUNTER — Other Ambulatory Visit (HOSPITAL_COMMUNITY): Payer: Self-pay

## 2021-04-18 ENCOUNTER — Other Ambulatory Visit (HOSPITAL_COMMUNITY): Payer: Self-pay

## 2021-04-18 ENCOUNTER — Other Ambulatory Visit: Payer: Self-pay

## 2021-04-19 ENCOUNTER — Other Ambulatory Visit: Payer: Self-pay

## 2021-04-20 ENCOUNTER — Other Ambulatory Visit: Payer: Self-pay

## 2021-06-06 ENCOUNTER — Encounter: Payer: No Typology Code available for payment source | Admitting: Nurse Practitioner

## 2021-06-16 ENCOUNTER — Other Ambulatory Visit: Payer: Self-pay

## 2021-06-16 DIAGNOSIS — Z1211 Encounter for screening for malignant neoplasm of colon: Secondary | ICD-10-CM

## 2021-06-25 ENCOUNTER — Other Ambulatory Visit: Payer: Self-pay | Admitting: Cardiovascular Disease

## 2021-06-27 ENCOUNTER — Other Ambulatory Visit: Payer: Self-pay

## 2021-06-27 MED ORDER — FLECAINIDE ACETATE 50 MG PO TABS
ORAL_TABLET | Freq: Two times a day (BID) | ORAL | 0 refills | Status: DC
  Filled 2021-06-27: qty 60, 30d supply, fill #0

## 2021-06-28 ENCOUNTER — Other Ambulatory Visit: Payer: Self-pay

## 2021-07-16 ENCOUNTER — Encounter: Payer: Self-pay | Admitting: Cardiovascular Disease

## 2021-07-17 ENCOUNTER — Encounter: Payer: Self-pay | Admitting: Cardiovascular Disease

## 2021-07-17 ENCOUNTER — Other Ambulatory Visit: Payer: Self-pay | Admitting: Cardiovascular Disease

## 2021-07-17 ENCOUNTER — Other Ambulatory Visit: Payer: Self-pay

## 2021-07-17 ENCOUNTER — Ambulatory Visit: Payer: No Typology Code available for payment source | Admitting: Cardiovascular Disease

## 2021-07-17 VITALS — BP 124/82 | HR 72 | Ht 62.0 in | Wt 226.6 lb

## 2021-07-17 DIAGNOSIS — I480 Paroxysmal atrial fibrillation: Secondary | ICD-10-CM

## 2021-07-17 DIAGNOSIS — I5032 Chronic diastolic (congestive) heart failure: Secondary | ICD-10-CM | POA: Diagnosis not present

## 2021-07-17 DIAGNOSIS — I4892 Unspecified atrial flutter: Secondary | ICD-10-CM | POA: Diagnosis not present

## 2021-07-17 MED ORDER — FUROSEMIDE 40 MG PO TABS
40.0000 mg | ORAL_TABLET | Freq: Every day | ORAL | 3 refills | Status: AC | PRN
  Filled 2021-07-17: qty 90, 90d supply, fill #0
  Filled 2022-01-17: qty 90, 90d supply, fill #1

## 2021-07-17 MED ORDER — FLECAINIDE ACETATE 50 MG PO TABS
ORAL_TABLET | Freq: Two times a day (BID) | ORAL | 3 refills | Status: DC
  Filled 2021-07-17: qty 180, fill #0
  Filled 2021-07-17 – 2021-07-21 (×2): qty 180, 90d supply, fill #0
  Filled 2021-07-21: qty 180, fill #0
  Filled 2021-07-21: qty 180, 90d supply, fill #0
  Filled 2021-10-08 – 2021-10-10 (×2): qty 180, 90d supply, fill #1
  Filled 2022-01-17: qty 180, 90d supply, fill #2
  Filled 2022-04-15: qty 180, 90d supply, fill #3

## 2021-07-17 MED ORDER — POTASSIUM CHLORIDE CRYS ER 20 MEQ PO TBCR
20.0000 meq | EXTENDED_RELEASE_TABLET | Freq: Every day | ORAL | 3 refills | Status: AC | PRN
  Filled 2021-07-17: qty 90, 90d supply, fill #0

## 2021-07-18 ENCOUNTER — Other Ambulatory Visit: Payer: Self-pay

## 2021-07-18 MED ORDER — METOPROLOL SUCCINATE ER 50 MG PO TB24
50.0000 mg | ORAL_TABLET | Freq: Two times a day (BID) | ORAL | 3 refills | Status: DC
  Filled 2021-07-18: qty 180, 90d supply, fill #0
  Filled 2021-10-08: qty 180, 90d supply, fill #1
  Filled 2022-01-10: qty 180, 90d supply, fill #2
  Filled 2022-04-15: qty 180, 90d supply, fill #3

## 2021-07-21 ENCOUNTER — Other Ambulatory Visit: Payer: Self-pay

## 2021-07-24 ENCOUNTER — Other Ambulatory Visit: Payer: Self-pay

## 2021-07-26 ENCOUNTER — Other Ambulatory Visit: Payer: Self-pay

## 2021-10-09 ENCOUNTER — Other Ambulatory Visit: Payer: Self-pay

## 2021-10-10 ENCOUNTER — Other Ambulatory Visit: Payer: Self-pay

## 2021-11-17 ENCOUNTER — Telehealth: Payer: Self-pay | Admitting: Cardiovascular Disease

## 2021-11-17 NOTE — Telephone Encounter (Signed)
Call received in Triage, Pt c/o of rapid A-Fib starting this morning.    Pt HR between 125-142 bpm.   At 545 Pt took her Flecainide 50 mg, and Toprol XL 50 mg.    Pt advised per Dr. Angelena Form, DOD, order: to take an additional Toprol XL tablet 50 mg to decrease her rapid A-Fib.  When I called Pt she checked her BP for me prior to sharing orders from Dr. Angelena Form.   BP was 113/99 and HR between 70-118.)    When I shared Dr. Camillia Herter order to take an additional Toprol XL, Pt wanted to take an additional Flecainide.  Pt advised that this was not the order I received;  Pt thought doing this would help her get back into rhythm.      Pt told that I would consult Dr. Acie Fredrickson RN and call her back.   930 am:  Pt called back, stated she is still in A-Fib, but HR is up and down.    Pt is not on anticoagulation per Sharrell Ku, PA-C Note 03/11/2021. Per Note Dr. Lovena Le recommended Pill in the pocket, 100 mg Flecainide and extra 50 mg Toprol XL, then resume normal dosing.  Pt stated she went back into rhythm, and did not follow up with the A-Fib clinic as originally set up.   Dr. Acie Fredrickson discussed  A-Fib Ablation and offered EP referral, but Pt refused per his note 07/17/21.    Pt wanted the Pill in the pocket order again, but order received from DOD was extra Toprol.  Pt offered appointment to have EKG with Ambrose Pancoast NP at 1120 am, to assess A-Fib, and recommend medication treatment plan.    Pt stated she will discuss with her husband, and call back if she wants the appointment.  Pt stated anxiety over the discussion of A-Fib ablation, and does not want the office visit to initiate  anything.   Pt advised if symptoms worsen to go to nearest ER.

## 2021-11-17 NOTE — Telephone Encounter (Signed)
Patient c/o Palpitations:  High priority if patient c/o lightheadedness, shortness of breath, or chest pain  How long have you had palpitations/irregular HR/ Afib? Are you having the symptoms now?  Patient states she woke up in afib this morning and is still currently in afib. She would like to know whether or not she needs to take extra medications   Are you currently experiencing lightheadedness, SOB or CP?  No   Do you have a history of afib (atrial fibrillation) or irregular heart rhythm?    Have you checked your BP or HR? (document readings if available):  Hasn't checked BP HR is currently 137-140  Are you experiencing any other symptoms?   No

## 2021-11-21 ENCOUNTER — Other Ambulatory Visit: Payer: Self-pay

## 2021-11-21 MED ORDER — FLUARIX QUADRIVALENT 0.5 ML IM SUSY
0.5000 mL | PREFILLED_SYRINGE | Freq: Once | INTRAMUSCULAR | 0 refills | Status: AC
  Filled 2021-11-21: qty 0.5, 1d supply, fill #0

## 2022-01-10 ENCOUNTER — Other Ambulatory Visit: Payer: Self-pay

## 2022-01-11 ENCOUNTER — Other Ambulatory Visit: Payer: Self-pay

## 2022-01-17 ENCOUNTER — Other Ambulatory Visit: Payer: Self-pay

## 2022-01-18 ENCOUNTER — Other Ambulatory Visit: Payer: Self-pay

## 2022-04-16 ENCOUNTER — Other Ambulatory Visit: Payer: Self-pay

## 2022-05-09 ENCOUNTER — Telehealth: Payer: Self-pay | Admitting: Cardiovascular Disease

## 2022-05-09 DIAGNOSIS — I480 Paroxysmal atrial fibrillation: Secondary | ICD-10-CM

## 2022-05-09 NOTE — Telephone Encounter (Signed)
Called patient about her message. Patient reported that she has been in A. FIB since 5:00 am this morning. Patient has a history of PAF. Patient stated she has been stressed since the beginning of the week, and she knows that this has caused her to go into A. FIB. Patient reports BPs of 130/83, 107/83, and 105/86; HR 130's going as low as 59 and 60 here and there. Patient also stated she feels jittery and anxious. Patient stated that she has some xanax, that she could take as well. Did not advise to take xanax. Will consult, DOD, Dr. Izora Ribas. Since patient knows when she is in A. FIB, symptomatic, it's been less than 24 hours, Dr. Izora Ribas recommend patient to take an extra Flecainide 50 mg and be referred to A. FIB clinic for medication management, and if patient does not go back to a regular rhythm, to go to the ED to get converted. Called patient back with recommendations. Patient stated in the past she took an extra metoprolol and an extra flecainide. Dr. Izora Ribas does not advise to take an extra metoprolol due to one of her low BP readings, and does not want her to become hypotensive. Encouraged patient to just take the flecainide and see if that helps, and to go to the ED if she does not convert with medication. Will place referral to A. FIB clinic.

## 2022-05-09 NOTE — Telephone Encounter (Signed)
Patient c/o Palpitations:  High priority if patient c/o lightheadedness, shortness of breath, or chest pain  How long have you had palpitations/irregular HR/ Afib? Are you having the symptoms now? Yes - This morning  Are you currently experiencing lightheadedness, SOB or CP? No  Do you have a history of afib (atrial fibrillation) or irregular heart rhythm? Yes  Have you checked your BP or HR? (document readings if available): 130/83 - HR - 95, 105/86 - HR - 126 ( All this morning)   Are you experiencing any other symptoms? Has a "jittery feeling", anxiety   Pt would like to know if she should take extra mediation

## 2022-05-22 ENCOUNTER — Ambulatory Visit (HOSPITAL_COMMUNITY): Payer: 59 | Admitting: Physician Assistant

## 2022-05-22 ENCOUNTER — Other Ambulatory Visit: Payer: Self-pay

## 2022-05-22 ENCOUNTER — Ambulatory Visit (HOSPITAL_COMMUNITY)
Admission: RE | Admit: 2022-05-22 | Discharge: 2022-05-22 | Disposition: A | Discharge status: Home / Self Care | Payer: 59 | Source: Ambulatory Visit | Attending: Physician Assistant | Admitting: Physician Assistant

## 2022-05-22 VITALS — BP 120/74 | HR 66 | Ht 62.0 in | Wt 219.0 lb

## 2022-05-22 DIAGNOSIS — Z7182 Exercise counseling: Secondary | ICD-10-CM | POA: Insufficient documentation

## 2022-05-22 DIAGNOSIS — G4733 Obstructive sleep apnea (adult) (pediatric): Secondary | ICD-10-CM | POA: Diagnosis not present

## 2022-05-22 DIAGNOSIS — I480 Paroxysmal atrial fibrillation: Secondary | ICD-10-CM | POA: Diagnosis not present

## 2022-05-22 DIAGNOSIS — R000 Tachycardia, unspecified: Secondary | ICD-10-CM | POA: Diagnosis not present

## 2022-05-22 DIAGNOSIS — Z6841 Body Mass Index (BMI) 40.0 and over, adult: Secondary | ICD-10-CM | POA: Diagnosis not present

## 2022-05-22 DIAGNOSIS — Z79899 Other long term (current) drug therapy: Secondary | ICD-10-CM | POA: Insufficient documentation

## 2022-05-22 DIAGNOSIS — I4892 Unspecified atrial flutter: Secondary | ICD-10-CM | POA: Insufficient documentation

## 2022-05-22 MED ORDER — METOPROLOL TARTRATE 25 MG PO TABS
25.0000 mg | ORAL_TABLET | Freq: Three times a day (TID) | ORAL | 1 refills | Status: DC | PRN
  Filled 2022-05-22: qty 60, 20d supply, fill #0

## 2022-05-22 MED ORDER — FLECAINIDE ACETATE 100 MG PO TABS
100.0000 mg | ORAL_TABLET | Freq: Two times a day (BID) | ORAL | 1 refills | Status: DC
  Filled 2022-05-22: qty 180, 90d supply, fill #0
  Filled 2022-08-16: qty 180, 90d supply, fill #1

## 2022-05-22 NOTE — Progress Notes (Signed)
Primary Care Physician: Anne Ng, NP Primary Cardiologist: Dr Elease Hashimoto Primary Electrophysiologist: none Referring Physician: Dr Loistine Chance Beth Bowman is a 62 y.o. female with a history of mild OSA, atrial flutter, atrial fibrillation who presents for consultation in the [facility].  The patient was admitted with rapid atrial flutter in 2016. She was started on flecainide in 2017, also found to have atrial fibrillation. She has been maintained on flecainide with scheduled dosing and PRN doses for breakthrough episodes. Patient has a CHADS2VASC score of 1.  On follow up today, patient reports that over the past year she has had 3-4 episodes of afib. She typically only has one per year. She states that she has been under considerable stress with the passing of her father and the health of her mother. Anxiety and stress seem to be triggers for her.   Today, she denies symptoms of palpitations, chest pain, shortness of breath, orthopnea, PND, lower extremity edema, dizziness, presyncope, syncope, snoring, daytime somnolence, bleeding, or neurologic sequela. The patient is tolerating medications without difficulties and is otherwise without complaint today.    Atrial Fibrillation Risk Factors:  she does have symptoms or diagnosis of sleep apnea. Sleep study 2016 very mild, CPAP not recommended.  she does not have a history of rheumatic fever.   she has a BMI of Body mass index is 40.06 kg/m.[name] Filed Weights   05/22/22 0830  Weight: 99.3 kg    Family History  Problem Relation Age of Onset   Arrhythmia Father    Premature CHD Neg Hx      Atrial Fibrillation Management history:  Previous antiarrhythmic drugs: flecainide  Previous cardioversions: none Previous ablations: none Anticoagulation history: none   Past Medical History:  Diagnosis Date   Atrial flutter (HCC)    Migraines    Morbid obesity (HCC)    OSA (obstructive  sleep apnea) 06/22/2014   Mild with AHI 6.7/hr   Past Surgical History:  Procedure Laterality Date   CARDIAC CATHETERIZATION N/A 12/27/2014   Procedure: Left Heart Cath and Coronary Angiography;  Surgeon: Corky Crafts, MD;  Location: [facility] INVASIVE CV LAB;  Service: Cardiovascular;  Laterality: N/A;   CESAREAN SECTION     x4   TONSILLECTOMY      Current Outpatient Medications  Medication Sig Dispense Refill   acetaminophen (TYLENOL) 500 MG tablet Take 1,000 mg by mouth every 8 (eight) hours as needed for mild pain or headache.     diphenhydrAMINE (BENADRYL) 25 MG tablet Take 25 mg by mouth every 6 (six) hours as needed for allergies.     flecainide (TAMBOCOR) 50 MG tablet TAKE 1 TABLET (50 MG TOTAL) BY MOUTH 2 (TWO) TIMES DAILY. 180 tablet 3   furosemide (LASIX) 40 MG tablet Take 1 tablet (40 mg total) by mouth daily as needed. 90 tablet 3   ibuprofen (ADVIL,MOTRIN) 200 MG tablet Take 200 mg by mouth every 6 (six) hours as needed (pain or migraine).     MAGNESIUM GLYCINATE PO Take 2 tablets by mouth at bedtime.     metoprolol succinate (TOPROL-XL) 50 MG 24 hr tablet Take 1 tablet (50 mg total) by mouth 2 (two) times daily. 180 tablet 3   potassium chloride SA (KLOR-CON M) 20 MEQ tablet Take 1 tablet (20 mEq total) by mouth daily as needed (when taking Lasix). 90 tablet 3   No current facility-administered medications for this encounter.   Facility-Administered Medications Ordered in Other Encounters  Medication Dose Route Frequency Provider Last Rate Last Admin   aminophylline injection 75 mg  75 mg Intravenous BID PRN Laurey Morale, MD        No Known Allergies  Social History   Socioeconomic History   Marital status: Married    Spouse name: Not on file   Number of children: Not on file   Years of education: Not on file   Highest education level: Not on file  Occupational History   Occupation: [employer]  Tobacco Use   Smoking status: Never   Smokeless tobacco: Never   Vaping Use   Vaping Use: Never used  Substance and Sexual Activity   Alcohol use: Yes    Alcohol/week: 0.0 standard drinks of alcohol    Comment: Rare, no history of abuse   Drug use: No   Sexual activity: Not on file  Other Topics Concern   Not on file  Social History Narrative   Lives in [place] with her husband.   Social Determinants of Health   Financial Resource Strain: Not on file  Food Insecurity: Not on file  Transportation Needs: Not on file  Physical Activity: Not on file  Stress: Not on file  Social Connections: Not on file  Intimate Partner Violence: Not on file     ROS- All systems are reviewed and negative except as per the HPI above.  Physical Exam: Vitals:   05/22/22 0830  BP: 120/74  Pulse: 66  Weight: 99.3 kg  Height: 5\' 2"  (1.575 m)    GEN- The patient is a well appearing female, alert and oriented x 3 today.   Head- normocephalic, atraumatic Eyes-  Sclera clear, conjunctiva pink Ears- hearing intact Oropharynx- clear Neck- supple  Lungs- Clear to ausculation bilaterally, normal work of breathing Heart- Regular rate and rhythm, no murmurs, rubs or gallops  GI- soft, NT, ND, + BS Extremities- no clubbing, cyanosis, or edema MS- no significant deformity or atrophy Skin- no rash or lesion Psych- euthymic mood, full affect Neuro- strength and sensation are intact  Wt Readings from Last 3 Encounters:  05/22/22 99.3 kg  07/17/21 102.8 kg  06/03/20 94.9 kg    EKG today demonstrates  SR, NST Vent. rate 66 BPM PR interval 176 ms QRS duration 94 ms QT/QTcB 428/448 ms  Echo 03/02/15 demonstrated - Left ventricle: The cavity size was normal. Systolic function was    normal. The estimated ejection fraction was in the range of 60%    to 65%. Wall motion was normal; there were no regional wall    motion abnormalities.  - Mitral valve: Calcified annulus. Mildly thickened leaflets .  - Left atrium: The atrium was mildly dilated.  - Atrial  septum: No defect or patent foramen ovale was identified.   Epic records are reviewed at length today.  CHA2DS2-VASc Score = 1  The patient's score is based upon: CHF History: 0 HTN History: 0 Diabetes History: 0 Stroke History: 0 Vascular Disease History: 0 Age Score: 0 Gender Score: 1       ASSESSMENT AND PLAN: 1. Paroxysmal Atrial Fibrillation/atrial flutter The patient's CHA2DS2-VASc score is 1, indicating a 0.6% annual risk of stroke.   Patient having more frequent episodes recently. We discussed rhythm control options. Will increase flecainide to 100 mg BID. She has previously been on this dose before. If she has breakthrough episodes, she may take an extra 100 mg of flecainide and 25 mg of Lopressor. Continue Toprol 50 mg BID and Lopressor 25 mg  q 8 hours for heart racing.  Return next week for ECG.   2. Obesity Body mass index is 40.06 kg/m. Lifestyle modification was discussed at length including regular exercise and weight reduction.  3. Obstructive sleep apnea Mild, CPAP not recommended on sleep study 2016.    Follow up in the AF clinic next week for ECG and then with Dr Elease Hashimoto or APP per recall.    Jorja Loa PA-C Afib Clinic [facility] [address] [place], [place] [address] [phone] 05/22/2022 9:08 AM

## 2022-05-22 NOTE — Patient Instructions (Signed)
Increase flecainide to 100mg  twice a day   Metoprolol tartrate (lopressor) -- 25mg  -- take 1 tablet every 8 hours as needed for breakthrough afib

## 2022-05-29 ENCOUNTER — Encounter (HOSPITAL_COMMUNITY): Payer: 59 | Admitting: Physician Assistant

## 2022-06-06 ENCOUNTER — Ambulatory Visit (HOSPITAL_COMMUNITY)
Admission: RE | Admit: 2022-06-06 | Discharge: 2022-06-06 | Disposition: A | Discharge status: Home / Self Care | Payer: 59 | Source: Ambulatory Visit | Attending: Physician Assistant | Admitting: Physician Assistant

## 2022-06-06 ENCOUNTER — Encounter (HOSPITAL_COMMUNITY): Payer: Self-pay | Admitting: Physician Assistant

## 2022-06-06 VITALS — HR 72

## 2022-06-06 DIAGNOSIS — I4892 Unspecified atrial flutter: Secondary | ICD-10-CM | POA: Diagnosis present

## 2022-06-06 DIAGNOSIS — I480 Paroxysmal atrial fibrillation: Secondary | ICD-10-CM | POA: Diagnosis not present

## 2022-06-06 NOTE — Progress Notes (Signed)
Patient returns for ECG after increasing flecainide. ECG shows:  SR, NST Vent. rate 70 BPM PR interval 166 ms QRS duration 96 ms QT/QTcB 438/473 ms  Continue flecainide 100 mg BID. Follow up in the AF clinic in 6 months.

## 2022-07-09 ENCOUNTER — Other Ambulatory Visit: Payer: Self-pay

## 2022-07-09 ENCOUNTER — Encounter: Payer: Self-pay | Admitting: Cardiovascular Disease

## 2022-07-09 MED ORDER — METOPROLOL TARTRATE 25 MG PO TABS
25.0000 mg | ORAL_TABLET | Freq: Three times a day (TID) | ORAL | 3 refills | Status: DC | PRN
  Filled 2022-07-09: qty 90, 30d supply, fill #0

## 2022-07-09 MED ORDER — METOPROLOL SUCCINATE ER 50 MG PO TB24
50.0000 mg | ORAL_TABLET | Freq: Two times a day (BID) | ORAL | 3 refills | Status: DC
  Filled 2022-07-09: qty 180, 90d supply, fill #0
  Filled 2022-10-07: qty 180, 90d supply, fill #1

## 2022-08-17 ENCOUNTER — Other Ambulatory Visit: Payer: Self-pay

## 2022-10-08 ENCOUNTER — Other Ambulatory Visit: Payer: Self-pay

## 2022-11-16 ENCOUNTER — Other Ambulatory Visit (HOSPITAL_COMMUNITY): Payer: Self-pay | Admitting: Physician Assistant

## 2022-11-16 ENCOUNTER — Other Ambulatory Visit: Payer: Self-pay

## 2022-11-16 DIAGNOSIS — I480 Paroxysmal atrial fibrillation: Secondary | ICD-10-CM

## 2022-11-16 MED ORDER — FLECAINIDE ACETATE 100 MG PO TABS
100.0000 mg | ORAL_TABLET | Freq: Two times a day (BID) | ORAL | 1 refills | Status: DC
Start: 2022-11-16 — End: 2023-05-13
  Filled 2022-11-16: qty 180, 90d supply, fill #0
  Filled 2023-02-16: qty 180, 90d supply, fill #1

## 2022-11-21 ENCOUNTER — Other Ambulatory Visit: Payer: Self-pay

## 2022-11-21 MED ORDER — INFLUENZA VIRUS VACC SPLIT PF (FLUZONE) 0.5 ML IM SUSY
0.5000 mL | PREFILLED_SYRINGE | Freq: Once | INTRAMUSCULAR | 0 refills | Status: AC
  Filled 2022-11-21: qty 0.5, 1d supply, fill #0

## 2022-12-10 ENCOUNTER — Other Ambulatory Visit: Payer: Self-pay

## 2022-12-10 ENCOUNTER — Ambulatory Visit (HOSPITAL_COMMUNITY)
Admission: RE | Admit: 2022-12-10 | Discharge: 2022-12-10 | Disposition: A | Discharge status: Home / Self Care | Payer: 59 | Source: Ambulatory Visit | Attending: Physician Assistant | Admitting: Physician Assistant

## 2022-12-10 VITALS — BP 138/84 | HR 80 | Ht 62.0 in | Wt 235.0 lb

## 2022-12-10 DIAGNOSIS — Z79899 Other long term (current) drug therapy: Secondary | ICD-10-CM | POA: Diagnosis not present

## 2022-12-10 DIAGNOSIS — I4892 Unspecified atrial flutter: Secondary | ICD-10-CM | POA: Diagnosis not present

## 2022-12-10 DIAGNOSIS — I480 Paroxysmal atrial fibrillation: Secondary | ICD-10-CM | POA: Diagnosis not present

## 2022-12-10 DIAGNOSIS — G4733 Obstructive sleep apnea (adult) (pediatric): Secondary | ICD-10-CM | POA: Diagnosis not present

## 2022-12-10 DIAGNOSIS — Z5181 Encounter for therapeutic drug level monitoring: Secondary | ICD-10-CM | POA: Diagnosis not present

## 2022-12-10 DIAGNOSIS — Z6841 Body Mass Index (BMI) 40.0 and over, adult: Secondary | ICD-10-CM | POA: Diagnosis not present

## 2022-12-10 MED ORDER — METOPROLOL SUCCINATE ER 50 MG PO TB24
50.0000 mg | ORAL_TABLET | Freq: Every day | ORAL | 3 refills | Status: DC
  Filled 2022-12-10 – 2023-01-06 (×2): qty 90, 90d supply, fill #0
  Filled 2023-02-28 (×2): qty 90, 90d supply, fill #1

## 2022-12-10 NOTE — Progress Notes (Signed)
Primary Care Physician: Anne Ng, NP Primary Cardiologist: Dr Elease Hashimoto Primary Electrophysiologist: none Referring Physician: Dr Beth Bowman is a 62 y.o. female with a history of mild OSA, atrial flutter, atrial fibrillation who presents for consultation in the [facility].  The patient was admitted with rapid atrial flutter in 2016. She was started on flecainide in 2017, also found to have atrial fibrillation. She has been maintained on flecainide with scheduled dosing and PRN doses for breakthrough episodes. Patient has a CHADS2VASC score of 1.  On follow up today, patient reports that she has done well from an afib standpoint. She has not had any further episodes since increasing flecainide. She does have fatigue with exertion.   Today, she denies symptoms of palpitations, chest pain, shortness of breath, orthopnea, PND, lower extremity edema, dizziness, presyncope, syncope, snoring, daytime somnolence, bleeding, or neurologic sequela. The patient is tolerating medications without difficulties and is otherwise without complaint today.    Atrial Fibrillation Risk Factors:  she does have symptoms or diagnosis of sleep apnea. Sleep study 2016 very mild, CPAP not recommended.  she does not have a history of rheumatic fever.   Atrial Fibrillation Management history:  Previous antiarrhythmic drugs: flecainide  Previous cardioversions: none Previous ablations: none Anticoagulation history: none   Past Medical History:  Diagnosis Date   Atrial flutter (HCC)    Migraines    Morbid obesity (HCC)    OSA (obstructive sleep apnea) 06/22/2014   Mild with AHI 6.7/hr    Current Outpatient Medications  Medication Sig Dispense Refill   acetaminophen (TYLENOL) 500 MG tablet Take 1,000 mg by mouth every 8 (eight) hours as needed for mild pain or headache.     chlorpheniramine (CHLOR-TRIMETON) 4 MG tablet Take 4 mg by mouth as  needed for allergies.     diphenhydrAMINE (BENADRYL) 25 MG tablet Take 25 mg by mouth every 6 (six) hours as needed for allergies.     flecainide (TAMBOCOR) 100 MG tablet Take 1 tablet (100 mg total) by mouth 2 (two) times daily. 180 tablet 1   furosemide (LASIX) 40 MG tablet Take 1 tablet (40 mg total) by mouth daily as needed. (Patient taking differently: Take 40 mg by mouth as needed.) 90 tablet 3   GUAIFENESIN ER PO Taking 3 times daily     ibuprofen (ADVIL,MOTRIN) 200 MG tablet Take 200 mg by mouth every 6 (six) hours as needed (pain or migraine).     MAGNESIUM GLYCINATE PO Take 2 tablets by mouth at bedtime.     metoprolol tartrate (LOPRESSOR) 25 MG tablet Take 1 tablet (25 mg total) by mouth every 8 (eight) hours as needed (breakthrough afib). 90 tablet 3   potassium chloride SA (KLOR-CON M) 20 MEQ tablet Take 1 tablet (20 mEq total) by mouth daily as needed (when taking Lasix). 90 tablet 3   metoprolol succinate (TOPROL-XL) 50 MG 24 hr tablet Take 1 tablet (50 mg total) by mouth daily. 90 tablet 3   No current facility-administered medications for this encounter.   Facility-Administered Medications Ordered in Other Encounters  Medication Dose Route Frequency Provider Last Rate Last Admin   aminophylline injection 75 mg  75 mg Intravenous BID PRN Laurey Morale, MD        ROS- All systems are reviewed and negative except as per the HPI above.  Physical Exam: Vitals:   12/10/22 0944  BP: 138/84  Pulse: 80  Weight: 106.6 kg  Height:  5\' 2"  (1.575 m)    GEN: Well nourished, well developed in no acute distress NECK: No JVD; No carotid bruits CARDIAC: Regular rate and rhythm, no murmurs, rubs, gallops RESPIRATORY:  Clear to auscultation without rales, wheezing or rhonchi  ABDOMEN: Soft, non-tender, non-distended EXTREMITIES:  No edema; No deformity    Wt Readings from Last 3 Encounters:  12/10/22 106.6 kg  05/22/22 99.3 kg  07/17/21 102.8 kg    EKG today demonstrates   SR, 1st degree AV block Vent. rate 80 BPM PR interval 218 ms QRS duration 100 ms QT/QTcB 386/445 ms   Echo 03/02/15 demonstrated - Left ventricle: The cavity size was normal. Systolic function was    normal. The estimated ejection fraction was in the range of 60%    to 65%. Wall motion was normal; there were no regional wall    motion abnormalities.  - Mitral valve: Calcified annulus. Mildly thickened leaflets .  - Left atrium: The atrium was mildly dilated.  - Atrial septum: No defect or patent foramen ovale was identified.   Epic records are reviewed at length today.  CHA2DS2-VASc Score = 1  The patient's score is based upon: CHF History: 0 HTN History: 0 Diabetes History: 0 Stroke History: 0 Vascular Disease History: 0 Age Score: 0 Gender Score: 1       ASSESSMENT AND PLAN: Paroxysmal Atrial Fibrillation/atrial flutter The patient's CHA2DS2-VASc score is 1, indicating a 0.6% annual risk of stroke.   Patient appears to be maintaining SR Continue flecainide 100 mg BID If she has breakthrough episodes, she may take an extra 100 mg of flecainide  Continue Lopressor 25 mg q 8 hours for heart racing.  Will decrease Toprol to 50 mg once daily to see if this helps fatigue Not currently on anticoagulation with low CV score  Obesity Body mass index is 42.98 kg/m.  Encouraged lifestyle modification  Obstructive sleep apnea Mild, CPAP not recommended on sleep study 2016.    Follow up with Dr Elease Hashimoto per recall. AF clinic in one year.    Jorja Loa PA-C Afib Clinic [facility] [address] [place], [place] [address] [phone] 12/10/2022 10:17 AM

## 2022-12-13 IMAGING — DX DG CHEST 2V
2 series · 2 of 2 positions shown · non-contrast
Comparison: 01/29/2020

CLINICAL DATA: COVID follow-up

EXAM:
CHEST - 2 VIEW

[chest pa]
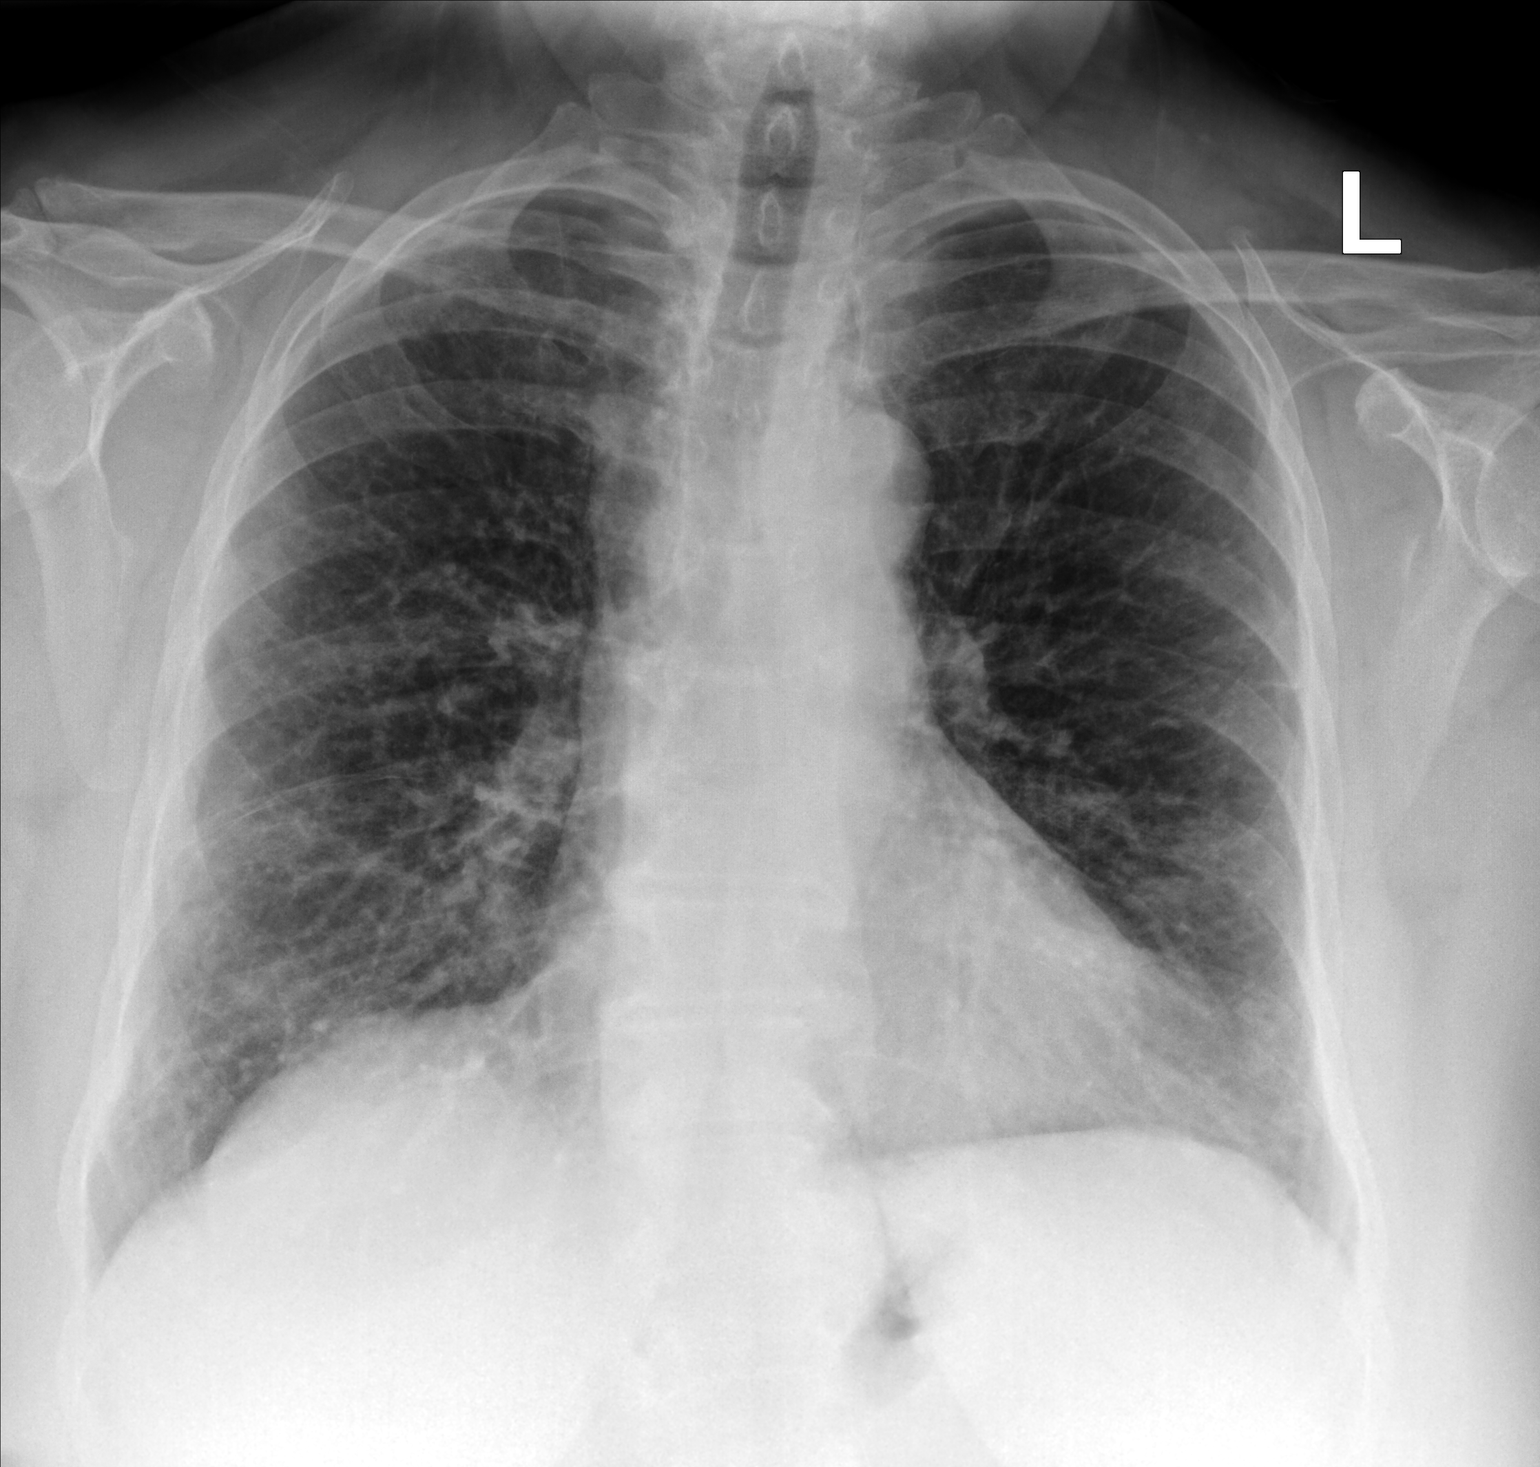

[chest lat]
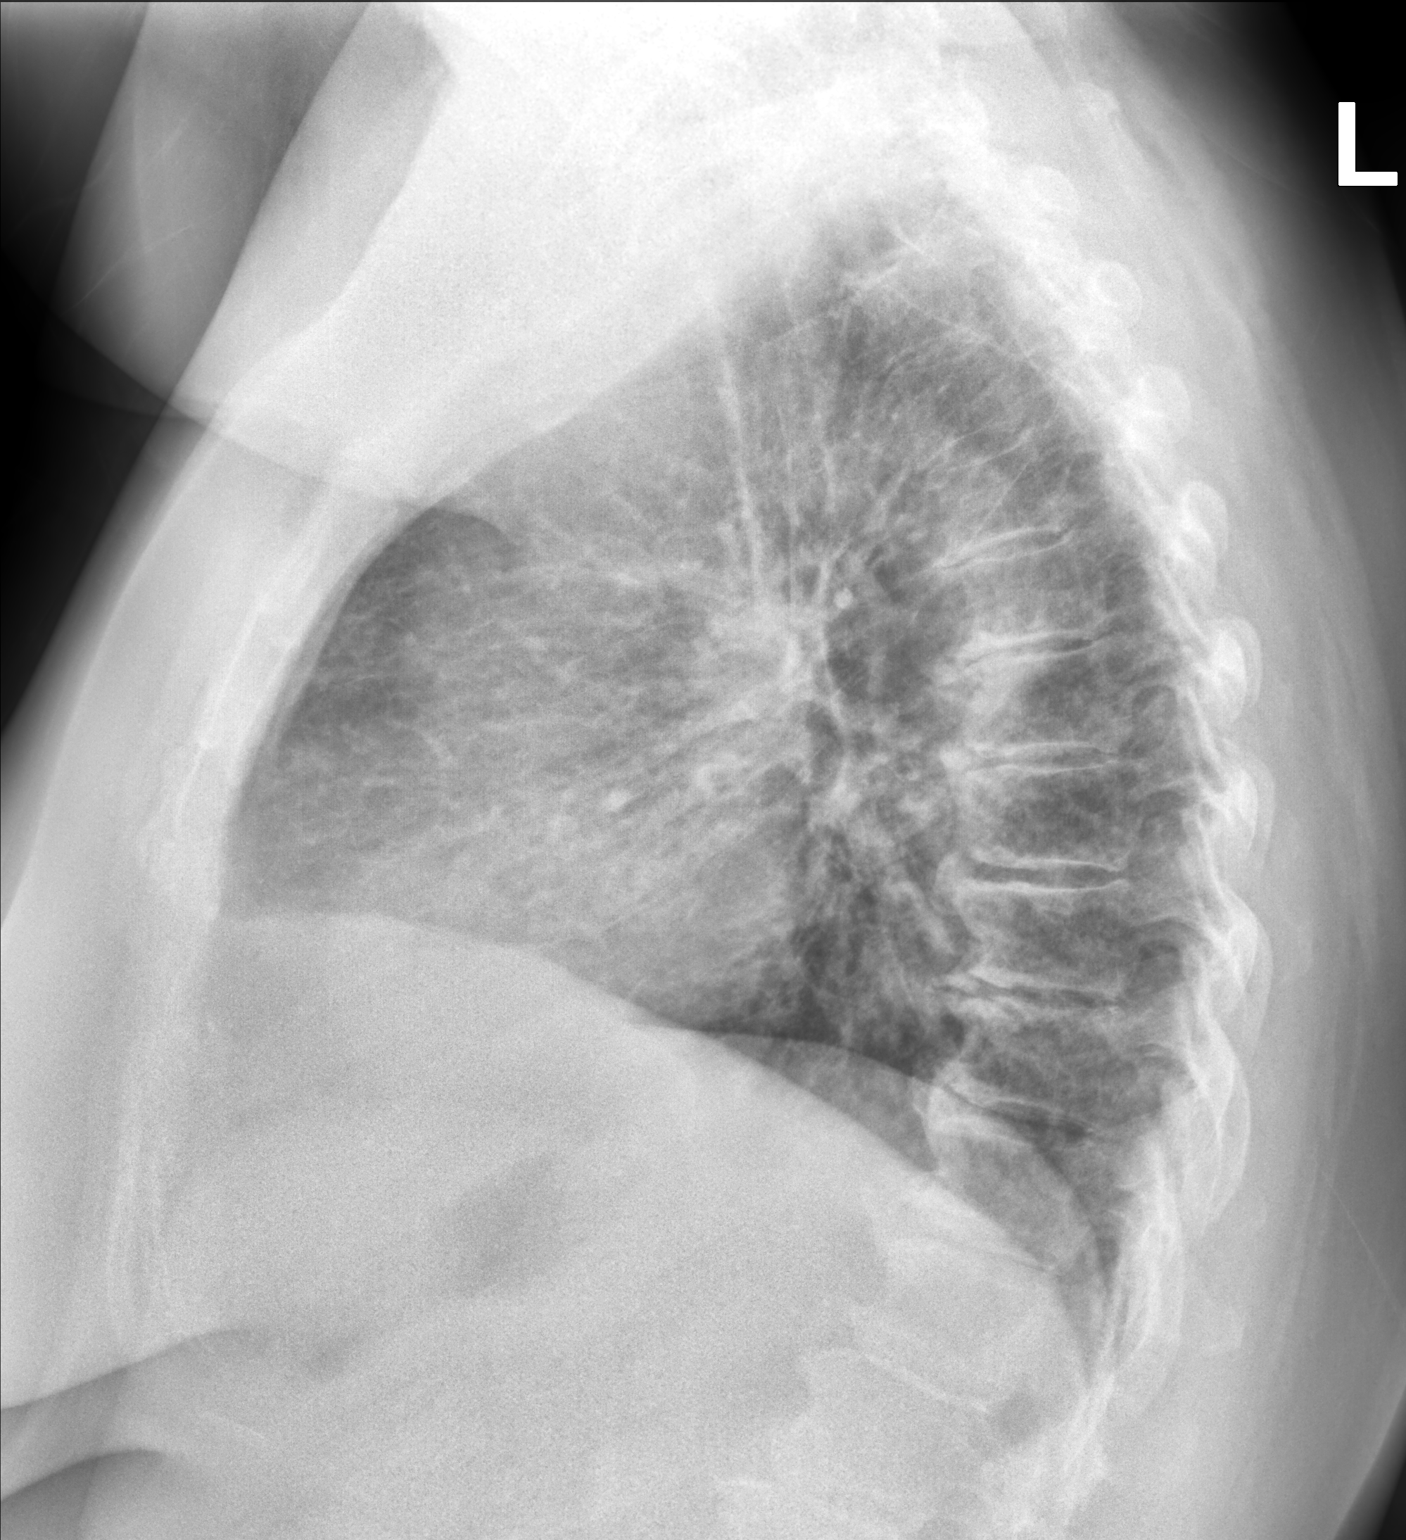

[2 of 2 positions shown; findings below may reference images not displayed]

FINDINGS: Trace residual opacity in the right lung base. No new area of
consolidation. Normal cardiomediastinal contours and pleural spaces.
IMPRESSION: Trace residual opacity in the right lung base, consistent with
resolving infection.

## 2023-01-02 ENCOUNTER — Other Ambulatory Visit: Payer: Self-pay

## 2023-01-02 ENCOUNTER — Telehealth: Payer: 59 | Admitting: Physician Assistant

## 2023-01-02 DIAGNOSIS — M545 Low back pain, unspecified: Secondary | ICD-10-CM | POA: Diagnosis not present

## 2023-01-02 MED ORDER — NAPROXEN 500 MG PO TABS
500.0000 mg | ORAL_TABLET | Freq: Two times a day (BID) | ORAL | 0 refills | Status: AC
Start: 2023-01-02 — End: ?
  Filled 2023-01-02: qty 15, 8d supply, fill #0

## 2023-01-02 MED ORDER — BACLOFEN 10 MG PO TABS
10.0000 mg | ORAL_TABLET | Freq: Three times a day (TID) | ORAL | 0 refills | Status: AC
Start: 2023-01-02 — End: ?
  Filled 2023-01-02: qty 15, 5d supply, fill #0

## 2023-01-02 NOTE — Progress Notes (Signed)
I have spent 5 minutes in review of e-visit questionnaire, review and updating patient chart, medical decision making and response to patient.   Mia Milan Cody Jacklynn Dehaas, PA-C    

## 2023-01-02 NOTE — Progress Notes (Signed)

## 2023-01-07 ENCOUNTER — Other Ambulatory Visit: Payer: Self-pay

## 2023-01-14 ENCOUNTER — Telehealth: Payer: 59 | Admitting: Family Medicine

## 2023-01-14 ENCOUNTER — Other Ambulatory Visit: Payer: Self-pay

## 2023-01-14 DIAGNOSIS — R3989 Other symptoms and signs involving the genitourinary system: Secondary | ICD-10-CM | POA: Diagnosis not present

## 2023-01-14 MED ORDER — CEPHALEXIN 500 MG PO CAPS
500.0000 mg | ORAL_CAPSULE | Freq: Two times a day (BID) | ORAL | 0 refills | Status: AC
Start: 2023-01-14 — End: 2023-01-21
  Filled 2023-01-14: qty 14, 7d supply, fill #0

## 2023-01-14 NOTE — Progress Notes (Signed)

## 2023-02-18 ENCOUNTER — Other Ambulatory Visit: Payer: Self-pay

## 2023-02-19 ENCOUNTER — Other Ambulatory Visit: Payer: Self-pay

## 2023-02-28 ENCOUNTER — Other Ambulatory Visit: Payer: Self-pay

## 2023-03-01 ENCOUNTER — Other Ambulatory Visit (HOSPITAL_COMMUNITY): Payer: Self-pay

## 2023-03-01 ENCOUNTER — Other Ambulatory Visit: Payer: Self-pay

## 2023-03-01 MED ORDER — METOPROLOL SUCCINATE ER 50 MG PO TB24
50.0000 mg | ORAL_TABLET | Freq: Every day | ORAL | 3 refills | Status: DC
  Filled 2023-03-01 (×2): qty 90, 90d supply, fill #0
  Filled 2023-05-23: qty 90, 90d supply, fill #1
  Filled 2023-08-25: qty 90, 90d supply, fill #2
  Filled 2023-08-27: qty 90, 90d supply, fill #0
  Filled 2023-11-23: qty 90, 90d supply, fill #1

## 2023-05-13 ENCOUNTER — Other Ambulatory Visit (HOSPITAL_COMMUNITY): Payer: Self-pay | Admitting: Physician Assistant

## 2023-05-13 ENCOUNTER — Other Ambulatory Visit: Payer: Self-pay

## 2023-05-13 DIAGNOSIS — I480 Paroxysmal atrial fibrillation: Secondary | ICD-10-CM

## 2023-05-13 MED ORDER — FLECAINIDE ACETATE 100 MG PO TABS
100.0000 mg | ORAL_TABLET | Freq: Two times a day (BID) | ORAL | 0 refills | Status: DC
Start: 2023-05-13 — End: 2023-09-09
  Filled 2023-05-13: qty 180, 90d supply, fill #0

## 2023-05-15 ENCOUNTER — Other Ambulatory Visit: Payer: Self-pay

## 2023-05-23 ENCOUNTER — Other Ambulatory Visit (HOSPITAL_COMMUNITY): Payer: Self-pay

## 2023-07-17 ENCOUNTER — Encounter: Payer: Self-pay | Admitting: Nurse Practitioner

## 2023-08-27 ENCOUNTER — Other Ambulatory Visit: Payer: Self-pay

## 2023-08-27 ENCOUNTER — Other Ambulatory Visit (HOSPITAL_COMMUNITY): Payer: Self-pay

## 2023-09-09 ENCOUNTER — Encounter: Payer: Self-pay | Admitting: Internal Medicine

## 2023-09-09 ENCOUNTER — Ambulatory Visit: Attending: Internal Medicine | Admitting: Internal Medicine

## 2023-09-09 ENCOUNTER — Other Ambulatory Visit (HOSPITAL_COMMUNITY): Payer: Self-pay

## 2023-09-09 VITALS — BP 114/80 | HR 73 | Ht 62.0 in | Wt 230.4 lb

## 2023-09-09 DIAGNOSIS — I480 Paroxysmal atrial fibrillation: Secondary | ICD-10-CM

## 2023-09-09 DIAGNOSIS — I4892 Unspecified atrial flutter: Secondary | ICD-10-CM | POA: Diagnosis not present

## 2023-09-09 DIAGNOSIS — E7800 Pure hypercholesterolemia, unspecified: Secondary | ICD-10-CM | POA: Diagnosis not present

## 2023-09-09 DIAGNOSIS — I440 Atrioventricular block, first degree: Secondary | ICD-10-CM | POA: Diagnosis not present

## 2023-09-09 DIAGNOSIS — Z5181 Encounter for therapeutic drug level monitoring: Secondary | ICD-10-CM | POA: Diagnosis not present

## 2023-09-09 DIAGNOSIS — Z79899 Other long term (current) drug therapy: Secondary | ICD-10-CM

## 2023-09-09 MED ORDER — FLECAINIDE ACETATE 100 MG PO TABS
100.0000 mg | ORAL_TABLET | Freq: Two times a day (BID) | ORAL | 3 refills | Status: AC
  Filled 2023-09-09: qty 137, 69d supply, fill #0
  Filled 2023-09-09: qty 43, 21d supply, fill #0
  Filled 2023-11-23: qty 180, 90d supply, fill #1
  Filled 2024-02-25: qty 180, 90d supply, fill #0

## 2023-09-09 NOTE — Progress Notes (Signed)
 Cardiology Office Note:  .   Date:  09/09/2023  ID:  Beth Bowman, DOB 1960/07/20, MRN [id number] PCP: No primary care provider on file.  [facility] Providers Cardiologist:  Soyla DELENA Merck, MD    History of Present Illness: .   Beth Bowman is a 63 y.o. female.  Discussed the use of AI scribe software for clinical note transcription with the patient, who gave verbal consent to proceed.  History of Present Illness Beth Bowman is a 63 year old female with atrial fibrillation and atrial flutter who presents for follow-up.  She has atrial flutter diagnosed in 2016 and atrial fibrillation, managed with flecainide  since 2017. Her current regimen includes flecainide  100 mg twice daily with an additional 100 mg as needed for prolonged episodes. No atrial fibrillation episodes have occurred in the past year after increasing the flecainide  dose. She also takes metoprolol  50 mg extended release daily at night, with an additional 25 mg of lopressor  as needed for breakthrough tachycardia.  She has a history of a first-degree AV block observed on a previous EKG, attributed by patient to illness at the time, with no significant changes noted since then.    ROS: negative except per HPI above.  Studies Reviewed: SABRA   EKG Interpretation Date/Time:  Monday September 09 2023 08:43:04 EDT Ventricular Rate:  72 PR Interval:  202 QRS Duration:  102 QT Interval:  444 QTC Calculation: 486 R Axis:   101  Text Interpretation: Normal sinus rhythm Rightward axis Low voltage QRS Nonspecific ST and T wave abnormality When compared with ECG of 10-Dec-2022 09:54, No significant change was found Confirmed by Merck Soyla ([id number]) on 09/09/2023 8:52:25 AM    Results DIAGNOSTIC EKG: Normal rhythm, first degree AV block, rightward axis (11/2022) EKG: Normal rhythm, rightward axis (05/2023) EKG: Normal rhythm, rightward axis, no first degree AV block (09/03/2023) Risk  Assessment/Calculations:    CHA2DS2-VASc Score = 1   This indicates a 0.6% annual risk of stroke. The patient's score is based upon: CHF History: 0 HTN History: 0 Diabetes History: 0 Stroke History: 0 Vascular Disease History: 0 Age Score: 0 Gender Score: 1       Physical Exam:   VS:  BP 114/80 (BP Location: Left Arm, Patient Position: Sitting, Cuff Size: Large)   Pulse 73   Ht 5' 2 (1.575 m)   Wt 230 lb 6.4 oz (104.5 kg)   SpO2 98%   BMI 42.14 kg/m    Wt Readings from Last 3 Encounters:  09/09/23 230 lb 6.4 oz (104.5 kg)  12/10/22 235 lb (106.6 kg)  05/22/22 219 lb (99.3 kg)     Physical Exam MEASUREMENTS: Weight- 218-224. GENERAL: Alert, cooperative, well developed, no acute distress. HEENT: Normocephalic, normal oropharynx, moist mucous membranes. CHEST: Clear to auscultation bilaterally, no wheezes, rhonchi, or crackles. CARDIOVASCULAR: Normal heart rate and rhythm, S1 and S2 normal without murmurs. Heart sounds normal. ABDOMEN: Soft, non-tender, non-distended, without organomegaly, normal bowel sounds. EXTREMITIES: No cyanosis or edema. No significant ankle swelling. NEUROLOGICAL: Cranial nerves grossly intact, moves all extremities without gross motor or sensory deficit.   ASSESSMENT AND PLAN: .    Assessment and Plan Assessment & Plan Atrial fibrillation and atrial flutter Well-managed with no episodes in the past year. EKG shows normal rhythm. First degree AV block not present today. - Continue flecainide  100 mg BID with an additional 100 mg PRN for breakthrough episodes. - Continue metoprolol  50 mg daily with 25 mg PRN lopressor  for  breakthrough episodes. - Refill flecainide  prescription.  First degree atrioventricular (AV) block Previously noted with minor PR interval variances. Current EKG shows borderline PR interval but does not meet criteria for first degree AV block. No significant concern at this time. - Monitor PR interval in future  EKGs.  Hyperlipidemia LDL cholesterol higher than desired. Prefers minimal medication use. Discussed potential impact of keto diet on cholesterol levels. Plan to recheck cholesterol levels. Discussed calcium scoring CT as a potential tiebreaker if LDL is elevated. - Order fasting cholesterol labs. - Consider calcium scoring CT if LDL is over 100 mg/dL. - Discuss potential dietary changes to reduce LDL, focusing on lean protein and reduced fat intake.  Mild obstructive sleep apnea Mild obstructive sleep apnea with CPAP not recommended. No significant symptoms affecting atrial fibrillation management. - Consider repeat sleep testing if fatigue persists.  Overweight Weight management discussed with focus on dietary changes. Current weight maintenance with keto diet, but cholesterol concerns noted. Discussed benefits of increasing protein intake while reducing fat. - Encourage dietary shift to lean protein with reduced fat intake. - Recommend strength training exercises to support metabolism and weight management.       Soyla Merck, MD, FACC

## 2023-09-09 NOTE — Patient Instructions (Addendum)
 Medication Instructions:  No Changes *If you need a refill on your cardiac medications before your next appointment, please call your pharmacy*  Lab Work: Today: Fasting Lipid Panel and LPa (Lipoprotein A)  Follow-Up: At [facility], you and your health needs are our priority.  As part of our continuing mission to provide you with exceptional heart care, our providers are all part of one team.  This team includes your primary Cardiologist (physician) and Advanced Practice Providers or APPs (Physician Assistants and Nurse Practitioners) who all work together to provide you with the care you need, when you need it.  Your next appointment:   1 year(s)  Provider:   Gayatri A Acharya, MD     Other Instructions Please call us  or send a MyChart message with any Cardiology related questions/concerns.  [phone].  Thank you!

## 2023-09-10 ENCOUNTER — Ambulatory Visit: Payer: Self-pay | Admitting: Internal Medicine

## 2023-09-10 LAB — LIPID PANEL
Chol/HDL Ratio: 3.1 ratio (ref 0.0–4.4)
Cholesterol, Total: 191 mg/dL (ref 100–199)
HDL: 62 mg/dL (ref >39)
LDL Chol Calc (NIH): 117 mg/dL — ABNORMAL HIGH (ref 0–99)
Triglycerides: 63 mg/dL (ref 0–149)
VLDL Cholesterol Cal: 12 mg/dL (ref 5–40)

## 2023-09-10 LAB — LIPOPROTEIN A (LPA): Lipoprotein (a): <8.4 nmol/L (ref <75.0)

## 2023-11-15 ENCOUNTER — Other Ambulatory Visit (HOSPITAL_COMMUNITY): Payer: Self-pay

## 2023-11-15 MED ORDER — FLUZONE 0.5 ML IM SUSY
0.5000 mL | PREFILLED_SYRINGE | Freq: Once | INTRAMUSCULAR | 0 refills | Status: AC
  Filled 2023-11-15: qty 0.5, 1d supply, fill #0

## 2023-11-23 ENCOUNTER — Other Ambulatory Visit: Payer: Self-pay

## 2023-11-25 ENCOUNTER — Other Ambulatory Visit: Payer: Self-pay

## 2023-11-26 ENCOUNTER — Other Ambulatory Visit (HOSPITAL_COMMUNITY): Payer: Self-pay

## 2023-11-26 ENCOUNTER — Other Ambulatory Visit: Payer: Self-pay

## 2023-11-26 ENCOUNTER — Other Ambulatory Visit: Payer: Self-pay | Admitting: Physician Assistant

## 2023-11-27 ENCOUNTER — Other Ambulatory Visit (HOSPITAL_COMMUNITY): Payer: Self-pay

## 2023-11-27 MED ORDER — METOPROLOL TARTRATE 25 MG PO TABS
25.0000 mg | ORAL_TABLET | Freq: Three times a day (TID) | ORAL | 3 refills | Status: AC | PRN
  Filled 2023-11-27: qty 90, 30d supply, fill #0

## 2024-02-11 ENCOUNTER — Other Ambulatory Visit: Payer: Self-pay

## 2024-02-11 ENCOUNTER — Other Ambulatory Visit (HOSPITAL_COMMUNITY): Payer: Self-pay

## 2024-02-11 ENCOUNTER — Other Ambulatory Visit: Payer: Self-pay | Admitting: Physician Assistant

## 2024-02-11 MED ORDER — METOPROLOL SUCCINATE ER 50 MG PO TB24
50.0000 mg | ORAL_TABLET | Freq: Every day | ORAL | 0 refills | Status: AC
  Filled 2024-02-11: qty 90, 90d supply, fill #0

## 2024-02-25 ENCOUNTER — Other Ambulatory Visit: Payer: Self-pay
